# Patient Record
Sex: Female | Born: 1960 | Race: White | Hispanic: No | Marital: Married | Smoking: Never smoker
Health system: Southern US, Community
[De-identification: ages and names within clinical notes are randomized; demographics above are authoritative.]

## PROBLEM LIST (undated history)

## (undated) DIAGNOSIS — K219 Gastro-esophageal reflux disease without esophagitis: Secondary | ICD-10-CM

## (undated) DIAGNOSIS — I10 Essential (primary) hypertension: Secondary | ICD-10-CM

## (undated) DIAGNOSIS — E039 Hypothyroidism, unspecified: Secondary | ICD-10-CM

## (undated) DIAGNOSIS — J45909 Unspecified asthma, uncomplicated: Secondary | ICD-10-CM

## (undated) DIAGNOSIS — D649 Anemia, unspecified: Secondary | ICD-10-CM

## (undated) DIAGNOSIS — E7800 Pure hypercholesterolemia, unspecified: Secondary | ICD-10-CM

## (undated) HISTORY — PX: CHOLECYSTECTOMY: SHX55

---

## 2012-07-21 DIAGNOSIS — E668 Other obesity: Secondary | ICD-10-CM | POA: Insufficient documentation

## 2012-07-21 DIAGNOSIS — N762 Acute vulvitis: Secondary | ICD-10-CM | POA: Insufficient documentation

## 2012-07-21 DIAGNOSIS — Z8639 Personal history of other endocrine, nutritional and metabolic disease: Secondary | ICD-10-CM | POA: Insufficient documentation

## 2012-07-21 DIAGNOSIS — N904 Leukoplakia of vulva: Secondary | ICD-10-CM | POA: Insufficient documentation

## 2012-07-21 DIAGNOSIS — J4520 Mild intermittent asthma, uncomplicated: Secondary | ICD-10-CM | POA: Insufficient documentation

## 2012-07-25 DIAGNOSIS — E039 Hypothyroidism, unspecified: Secondary | ICD-10-CM | POA: Insufficient documentation

## 2012-07-25 DIAGNOSIS — D649 Anemia, unspecified: Secondary | ICD-10-CM | POA: Insufficient documentation

## 2012-07-27 DIAGNOSIS — I10 Essential (primary) hypertension: Secondary | ICD-10-CM | POA: Insufficient documentation

## 2013-07-24 ENCOUNTER — Other Ambulatory Visit: Payer: Self-pay | Admitting: *Deleted

## 2013-07-24 ENCOUNTER — Ambulatory Visit (INDEPENDENT_AMBULATORY_CARE_PROVIDER_SITE_OTHER): Payer: BC Managed Care – PPO | Admitting: Podiatry

## 2013-07-24 ENCOUNTER — Ambulatory Visit (INDEPENDENT_AMBULATORY_CARE_PROVIDER_SITE_OTHER): Payer: BC Managed Care – PPO

## 2013-07-24 ENCOUNTER — Encounter: Payer: Self-pay | Admitting: Podiatry

## 2013-07-24 VITALS — BP 113/66 | HR 65 | Resp 16 | Ht 63.0 in | Wt 205.0 lb

## 2013-07-24 DIAGNOSIS — M722 Plantar fascial fibromatosis: Secondary | ICD-10-CM

## 2013-07-24 DIAGNOSIS — M2010 Hallux valgus (acquired), unspecified foot: Secondary | ICD-10-CM

## 2013-07-24 DIAGNOSIS — M7750 Other enthesopathy of unspecified foot: Secondary | ICD-10-CM

## 2013-07-24 MED ORDER — TRIAMCINOLONE ACETONIDE 10 MG/ML IJ SUSP
10.0000 mg | Freq: Once | INTRAMUSCULAR | Status: AC
  Administered 2013-07-24: 10 mg

## 2013-07-24 NOTE — Progress Notes (Signed)
Subjective:     Patient ID: Beth Krause, female   DOB: 06/25/1960, 53 y.o.   MRN: [id number]  Foot Pain   patient presents stating I'm having a lot of pain in my left heel for the last month and also my feet in general get sore and I did calluses on both feet. Patient has tried increase activity and lose weight but the foot has been bothering her and preventing her from doing what she wants   Review of Systems  All other systems reviewed and are negative.      Objective:   Physical Exam  Nursing note and vitals reviewed. Constitutional: She is oriented to person, place, and time.  Cardiovascular: Intact distal pulses.   Musculoskeletal: Normal range of motion.  Neurological: She is oriented to person, place, and time.  Skin: Skin is warm.   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 Injected the left plantar fashion renograms Kenalog 5 of Xylocaine Marcaine mixture applied fascially brace and scanned for custom orthotic devices. Debris did plantar tissue to reduce pressure

## 2013-07-24 NOTE — Progress Notes (Signed)
   Subjective:    Patient ID: Beth Krause, female    DOB: 1960/09/11, 53 y.o.   MRN: [id number]  HPI Comments: i have a callus on both feet, a place on my left 5th toe, and heel pain in my left foot. i have had the foot problems for several weeks. They are getting worse. It hurts to walk and stand and at night im crying with pain. i take tylenol for the pain. i went to a podiatrist in [place] and he trimmed my rt foot and dug something out about 3 yrs ago.  Foot Pain      Review of Systems  All other systems reviewed and are negative.      Objective:   Physical Exam        Assessment & Plan:

## 2013-07-24 NOTE — Patient Instructions (Signed)

## 2013-08-13 ENCOUNTER — Encounter: Payer: Self-pay | Admitting: Podiatry

## 2013-08-13 NOTE — Progress Notes (Signed)
Patients orthotics are in .

## 2013-08-21 ENCOUNTER — Ambulatory Visit (INDEPENDENT_AMBULATORY_CARE_PROVIDER_SITE_OTHER): Payer: BC Managed Care – PPO | Admitting: *Deleted

## 2013-08-21 VITALS — BP 113/66 | HR 65 | Resp 16

## 2013-08-21 DIAGNOSIS — M722 Plantar fascial fibromatosis: Secondary | ICD-10-CM

## 2013-08-21 NOTE — Progress Notes (Signed)
Pt presents to pick up orthotics. Went over wearing instructions with pt. 

## 2013-08-21 NOTE — Patient Instructions (Signed)

## 2015-06-18 ENCOUNTER — Ambulatory Visit: Payer: Self-pay | Admitting: Podiatry

## 2015-07-01 ENCOUNTER — Telehealth: Payer: Self-pay | Admitting: Podiatry

## 2015-07-01 NOTE — Telephone Encounter (Signed)
Pt lvm to reschedule her appt. I called back and left message for pt to call back to reschedule

## 2015-07-16 ENCOUNTER — Ambulatory Visit (INDEPENDENT_AMBULATORY_CARE_PROVIDER_SITE_OTHER): Payer: BLUE CROSS/BLUE SHIELD | Admitting: Podiatry

## 2015-07-16 DIAGNOSIS — M722 Plantar fascial fibromatosis: Secondary | ICD-10-CM | POA: Diagnosis not present

## 2015-07-16 MED ORDER — METHYLPREDNISOLONE 4 MG PO TBPK: ORAL_TABLET | ORAL | Status: DC

## 2015-07-17 NOTE — Progress Notes (Signed)
She presents today with continued heel pain left foot. She states that she did great for many months past. She had a recurrence saw Dr. Charlsie Merlesregal last.  Objective: Vital signs are stable she is alert and oriented 3. She has pain on palpation medial calcaneal tubercle of the left heel. Pulses are strongly palpable. Neurologic sensorium is intact. Degenerative flexor intact. Cutaneous evaluation shows a well hydrated cutis.  Assessment: Chronic intractable plantar fasciitis left.  Plan: I reinjected the left heel today with Kenalog and local anesthetic. Prescribed methylprednisolone. Patient stated that she had a plantar fascial brace and a night splint and I recommended that she use that. We did discuss appropriate shoe gear stretching exercises ice therapy and shoe gear modifications. Follow up with her as needed.

## 2015-09-10 ENCOUNTER — Telehealth: Payer: Self-pay | Admitting: *Deleted

## 2015-09-10 NOTE — Telephone Encounter (Signed)
ok 

## 2015-09-10 NOTE — Telephone Encounter (Addendum)
Pt states she is going on vacation and her foot is starting to hurt, she had a steroid dose pack about 2 months ago and would like a refill.  Pt states she doesn't have time to come in for an appt, but will make an appt if the problem continues after vacation. 09/11/2015-Dr. Hyatt okayed refill of the Medrol dose pack.  Left message informing pt the Medrol dose pack would be at the [facility] in [place].

## 2015-09-11 MED ORDER — METHYLPREDNISOLONE 4 MG PO TBPK: ORAL_TABLET | ORAL | 0 refills | Status: DC

## 2015-10-01 ENCOUNTER — Encounter: Payer: Self-pay | Admitting: Podiatry

## 2015-10-01 ENCOUNTER — Ambulatory Visit (INDEPENDENT_AMBULATORY_CARE_PROVIDER_SITE_OTHER): Payer: BLUE CROSS/BLUE SHIELD | Admitting: Podiatry

## 2015-10-01 DIAGNOSIS — Q828 Other specified congenital malformations of skin: Secondary | ICD-10-CM

## 2015-10-01 DIAGNOSIS — M722 Plantar fascial fibromatosis: Secondary | ICD-10-CM | POA: Diagnosis not present

## 2015-10-01 NOTE — Progress Notes (Signed)
She presents today for follow-up of her plantar fasciitis of her left foot. She states that the medial aspect of the heel is doing much better however the lateral aspect is just killing me. She is also concerned about an area of reactive hyperkeratosis of fifth metatarsal head of the right foot. She relates that she had an MRI performed in March of last year. She will bring that with her on her next visit.  Objective: Vital signs are stable she is alert and oriented 3 pain on palpation to the medial aspect of the left heel has resolved however she has significant pain to the lateral aspect of the left heel plantar tubercle and lateral fifth metatarsal area. Right foot does demonstrates reactive hyperkeratosis of fifth metatarsal head of the right foot with no open lesions.  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  Plan: I injected the lateral aspect of the foot today reaching the central plantar calcaneal tubercle as well. This was injected with Kenalog and local anesthetic also debrided all reactive hyperkeratosis. Encouraged her to bring the MRI with her that her next visit. I also prescribed Voltaren gel and have requested a compounded agent for anti-inflammatory and pain.

## 2015-10-03 ENCOUNTER — Telehealth: Payer: Self-pay | Admitting: *Deleted

## 2015-10-03 MED ORDER — NONFORMULARY OR COMPOUNDED ITEM: 6 refills | Status: DC

## 2015-10-03 NOTE — Telephone Encounter (Addendum)
-----   Message from Kristian CoveyAshley E Prevette, [facility] sent at 10/01/2015  4:17 PM EDT ----- Regarding: Compound med Dr. Al CorpusHyatt asked if you could send her in a compounding cream that's for pain and anti-inflammatory. Thanks! [phone]/31/2017-Faxed to [facility].

## 2015-10-09 DIAGNOSIS — M722 Plantar fascial fibromatosis: Secondary | ICD-10-CM | POA: Insufficient documentation

## 2015-11-12 ENCOUNTER — Ambulatory Visit: Payer: BLUE CROSS/BLUE SHIELD | Admitting: Podiatry

## 2017-10-20 DIAGNOSIS — M898X1 Other specified disorders of bone, shoulder: Secondary | ICD-10-CM | POA: Insufficient documentation

## 2019-01-03 ENCOUNTER — Other Ambulatory Visit: Payer: Self-pay | Admitting: Obstetrics and Gynecology

## 2019-01-03 DIAGNOSIS — Z1231 Encounter for screening mammogram for malignant neoplasm of breast: Secondary | ICD-10-CM

## 2019-01-16 ENCOUNTER — Encounter (INDEPENDENT_AMBULATORY_CARE_PROVIDER_SITE_OTHER): Payer: Self-pay

## 2019-01-16 ENCOUNTER — Other Ambulatory Visit: Payer: Self-pay

## 2019-01-16 ENCOUNTER — Ambulatory Visit
Admission: RE | Admit: 2019-01-16 | Discharge: 2019-01-16 | Disposition: A | Discharge status: Home / Self Care | Payer: BC Managed Care – PPO | Source: Ambulatory Visit | Attending: Obstetrics and Gynecology | Admitting: Obstetrics and Gynecology

## 2019-01-16 DIAGNOSIS — Z1231 Encounter for screening mammogram for malignant neoplasm of breast: Secondary | ICD-10-CM | POA: Diagnosis present

## 2019-01-17 ENCOUNTER — Inpatient Hospital Stay
Admission: RE | Admit: 2019-01-17 | Discharge: 2019-01-17 | Disposition: A | Discharge status: Home / Self Care | Payer: Self-pay | Source: Ambulatory Visit | Attending: *Deleted | Admitting: *Deleted

## 2019-01-17 ENCOUNTER — Other Ambulatory Visit: Payer: Self-pay | Admitting: *Deleted

## 2019-01-17 DIAGNOSIS — Z1231 Encounter for screening mammogram for malignant neoplasm of breast: Secondary | ICD-10-CM

## 2019-06-06 ENCOUNTER — Ambulatory Visit: Payer: 59 | Admitting: Podiatry

## 2019-06-06 ENCOUNTER — Other Ambulatory Visit: Payer: Self-pay | Admitting: Podiatry

## 2019-06-06 ENCOUNTER — Ambulatory Visit (INDEPENDENT_AMBULATORY_CARE_PROVIDER_SITE_OTHER): Payer: 59

## 2019-06-06 ENCOUNTER — Other Ambulatory Visit: Payer: Self-pay

## 2019-06-06 VITALS — Temp 98.1°F

## 2019-06-06 DIAGNOSIS — M2042 Other hammer toe(s) (acquired), left foot: Secondary | ICD-10-CM | POA: Diagnosis not present

## 2019-06-06 DIAGNOSIS — M2041 Other hammer toe(s) (acquired), right foot: Secondary | ICD-10-CM

## 2019-06-06 DIAGNOSIS — Q828 Other specified congenital malformations of skin: Secondary | ICD-10-CM

## 2019-06-06 DIAGNOSIS — N950 Postmenopausal bleeding: Secondary | ICD-10-CM | POA: Insufficient documentation

## 2019-06-06 DIAGNOSIS — M7752 Other enthesopathy of left foot: Secondary | ICD-10-CM | POA: Diagnosis not present

## 2019-06-06 DIAGNOSIS — M7751 Other enthesopathy of right foot: Secondary | ICD-10-CM | POA: Diagnosis not present

## 2019-06-06 DIAGNOSIS — M722 Plantar fascial fibromatosis: Secondary | ICD-10-CM

## 2019-06-06 NOTE — Progress Notes (Signed)
  Subjective:  Patient ID: Beth Krause, female    DOB: 29-Mar-1960,  MRN: [id number] HPI Chief Complaint  Patient presents with  . Foot Pain    Patient presents today for bilat painful callous lesions bottom of 5th met x weeks.  She also c/o pain in her feet all the time, says her toes ache and left is worse than right.  She denies any pain with bunions  . Callouses    59 y.o. female presents with the above complaint.   ROS: Denies fever chills nausea vomiting muscle aches pains calf pain back pain chest pain shortness of breath.  No past medical history on file. No past surgical history on file.  Current Outpatient Medications:  .  Cholecalciferol (VITAMIN D3) 1.25 MG (50000 UT) TABS, Take by mouth., Disp: , Rfl:  .  BREO ELLIPTA 200-25 MCG/INH AEPB, Inhale 1 puff into the lungs daily., Disp: , Rfl:  .  cetirizine (ZYRTEC) 10 MG tablet, Take by mouth., Disp: , Rfl:  .  COMBIVENT RESPIMAT 20-100 MCG/ACT AERS respimat, , Disp: , Rfl:  .  hyoscyamine (LEVSIN) 0.125 MG tablet, Take by mouth every 4 (four) hours as needed., Disp: , Rfl:  .  levothyroxine (SYNTHROID, LEVOTHROID) 50 MCG tablet, , Disp: , Rfl:  .  lisinopril (PRINIVIL,ZESTRIL) 10 MG tablet, , Disp: , Rfl:  .  omeprazole (PRILOSEC) 40 MG capsule, Take by mouth., Disp: , Rfl:  .  PROAIR HFA 108 (90 BASE) MCG/ACT inhaler, , Disp: , Rfl:   No Known Allergies Review of Systems Objective:   Vitals:   06/06/19 1551  Temp: 98.1 F (36.7 C)    General: Well developed, nourished, in no acute distress, alert and oriented x3   Dermatological: Skin is warm, dry and supple bilateral. Nails x 10 are well maintained; remaining integument appears unremarkable at this time. There are no open sores, no preulcerative lesions, no rash or signs of infection present.  Porokeratotic lesion plantar aspect subfifth metatarsals bilateral.  Underlying bursitis.  Vascular: Dorsalis Pedis artery and Posterior Tibial artery pedal pulses are 2/4  bilateral with immedate capillary fill time. Pedal hair growth present. No varicosities and no lower extremity edema present bilateral.   Neruologic: Grossly intact via light touch bilateral. Vibratory intact via tuning fork bilateral. Protective threshold with Semmes Wienstein monofilament intact to all pedal sites bilateral. Patellar and Achilles deep tendon reflexes 2+ bilateral. No Babinski or clonus noted bilateral.   Musculoskeletal: No gross boney pedal deformities bilateral. No pain, crepitus, or limitation noted with foot and ankle range of motion bilateral. Muscular strength 5/5 in all groups tested bilateral.  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  Her foot was placed in a curve shoe causing lateral pain and swelling.  Gait: Unassisted, Nonantalgic.    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  Mild hammertoe deformity.  Assessment & Plan:   Assessment: Bursitis subfifth met bilateral with porokeratosis.  Plan: Injected dexamethasone 2 mg sublesional he today into the bursa and then debrided the reactive hyperkeratotic lesions.  She was also scanned for a new set of orthotics with some 5 cut outs.     Hakim Minniefield T. [place], [place]

## 2019-06-11 ENCOUNTER — Encounter: Payer: Self-pay | Admitting: Podiatry

## 2019-06-12 ENCOUNTER — Encounter: Payer: Self-pay | Admitting: Podiatry

## 2019-06-14 MED ORDER — NONFORMULARY OR COMPOUNDED ITEM: 2 refills | Status: DC

## 2019-06-14 NOTE — Telephone Encounter (Signed)
Patient has been notified of medication renewal.  I informed her that I will call it into [facility] in [place].  She stated "that's perfect, its on my way home"   Antiinflammatory cream has been called into [facility].

## 2019-07-04 ENCOUNTER — Ambulatory Visit: Payer: No Typology Code available for payment source | Admitting: Orthotics

## 2019-07-04 ENCOUNTER — Other Ambulatory Visit: Payer: Self-pay

## 2019-07-04 DIAGNOSIS — M722 Plantar fascial fibromatosis: Secondary | ICD-10-CM

## 2019-07-04 DIAGNOSIS — M7752 Other enthesopathy of left foot: Secondary | ICD-10-CM

## 2019-07-04 NOTE — Progress Notes (Signed)
Patient came in today to pick up custom made foot orthotics.  The goals were accomplished and the patient reported no dissatisfaction with said orthotics.  Patient was advised of breakin period and how to report any issues. 

## 2019-07-18 ENCOUNTER — Ambulatory Visit (INDEPENDENT_AMBULATORY_CARE_PROVIDER_SITE_OTHER): Payer: No Typology Code available for payment source | Admitting: Orthotics

## 2019-07-18 ENCOUNTER — Other Ambulatory Visit: Payer: Self-pay

## 2019-07-18 DIAGNOSIS — M7752 Other enthesopathy of left foot: Secondary | ICD-10-CM

## 2019-07-18 DIAGNOSIS — M722 Plantar fascial fibromatosis: Secondary | ICD-10-CM

## 2019-07-18 NOTE — Progress Notes (Signed)
Sending f/o back to get remade:  Wider, hug arch more, deep heel cup

## 2020-01-09 ENCOUNTER — Encounter: Payer: Self-pay | Admitting: Podiatry

## 2020-01-09 ENCOUNTER — Other Ambulatory Visit: Payer: Self-pay

## 2020-01-09 ENCOUNTER — Ambulatory Visit: Payer: No Typology Code available for payment source | Admitting: Podiatry

## 2020-01-09 DIAGNOSIS — L989 Disorder of the skin and subcutaneous tissue, unspecified: Secondary | ICD-10-CM

## 2020-01-09 DIAGNOSIS — M722 Plantar fascial fibromatosis: Secondary | ICD-10-CM

## 2020-01-09 MED ORDER — TRIAMCINOLONE ACETONIDE 40 MG/ML IJ SUSP
20.0000 mg | Freq: Once | INTRAMUSCULAR | Status: AC
  Administered 2020-01-09: 20 mg

## 2020-01-09 NOTE — Progress Notes (Signed)
She presents today for follow-up of her bilateral heel states that she Figgs that the fasciitis is back in both heels but the left one is the worst.  She is also getting some small callused areas on the lateral aspect of the fifth metatarsal base area.  Objective: Vital signs are stable alert and oriented x3.  Pulses are palpable.  She has a small linear area of porokeratotic lesions most likely secondary to the orthotic riding on that ridge.  She also has some mild tenderness on palpation medial calcaneal tubercle of the left heel.  Assessment: Residual plantar fasciitis left.  Porokeratosis left foot.  Plan: Debridement of the porokeratosis today reinjected left heel today 20 mg Kenalog 5 mg Marcaine she will see Raiford Noble for follow-up to widen the orthotic so that does not rub her on that range.

## 2020-01-16 ENCOUNTER — Encounter: Payer: Self-pay | Admitting: Podiatry

## 2020-01-16 ENCOUNTER — Other Ambulatory Visit: Payer: Self-pay

## 2020-01-16 ENCOUNTER — Ambulatory Visit: Payer: No Typology Code available for payment source | Admitting: Orthotics

## 2020-01-16 DIAGNOSIS — Q828 Other specified congenital malformations of skin: Secondary | ICD-10-CM

## 2020-01-16 DIAGNOSIS — M722 Plantar fascial fibromatosis: Secondary | ICD-10-CM

## 2020-01-16 NOTE — Progress Notes (Signed)
Modify f/o and make wider w/ lateral wedge.

## 2020-01-18 NOTE — Telephone Encounter (Signed)
Aware. 

## 2020-02-20 ENCOUNTER — Encounter: Payer: No Typology Code available for payment source | Admitting: Orthotics

## 2020-03-05 ENCOUNTER — Other Ambulatory Visit: Payer: Self-pay

## 2020-03-05 ENCOUNTER — Other Ambulatory Visit: Payer: Self-pay | Admitting: Obstetrics and Gynecology

## 2020-03-05 ENCOUNTER — Ambulatory Visit: Payer: No Typology Code available for payment source | Admitting: Orthotics

## 2020-03-05 DIAGNOSIS — Z1231 Encounter for screening mammogram for malignant neoplasm of breast: Secondary | ICD-10-CM

## 2020-03-05 DIAGNOSIS — M722 Plantar fascial fibromatosis: Secondary | ICD-10-CM

## 2020-03-05 DIAGNOSIS — Q828 Other specified congenital malformations of skin: Secondary | ICD-10-CM

## 2020-03-05 NOTE — Progress Notes (Signed)
Patient came in today to pick up custom made foot orthotics.  The goals were accomplished and the patient reported no dissatisfaction with said orthotics.  Patient was advised of breakin period and how to report any issues. 

## 2020-03-25 ENCOUNTER — Ambulatory Visit
Admission: RE | Admit: 2020-03-25 | Discharge: 2020-03-25 | Disposition: A | Discharge status: Home / Self Care | Payer: No Typology Code available for payment source | Source: Ambulatory Visit

## 2020-03-25 ENCOUNTER — Other Ambulatory Visit: Payer: Self-pay

## 2020-03-25 DIAGNOSIS — Z1231 Encounter for screening mammogram for malignant neoplasm of breast: Secondary | ICD-10-CM | POA: Insufficient documentation

## 2020-06-24 ENCOUNTER — Encounter: Payer: Self-pay | Admitting: Podiatry

## 2020-06-24 ENCOUNTER — Other Ambulatory Visit: Payer: Self-pay

## 2020-06-24 ENCOUNTER — Ambulatory Visit: Payer: No Typology Code available for payment source | Admitting: Podiatry

## 2020-06-24 DIAGNOSIS — M722 Plantar fascial fibromatosis: Secondary | ICD-10-CM | POA: Diagnosis not present

## 2020-06-24 DIAGNOSIS — B070 Plantar wart: Secondary | ICD-10-CM

## 2020-06-24 MED ORDER — BETAMETHASONE SOD PHOS & ACET 6 (3-3) MG/ML IJ SUSP: 3.0000 mg | Freq: Once | INTRAMUSCULAR | Status: DC

## 2020-06-24 NOTE — Progress Notes (Signed)
   Subjective: 60 y.o. female presenting today for multiple complaints today regarding left heel pain that is been going on for about 2 weeks now.  She states that it is very painful all the time.  She denies a history of injury.  She does have a history of plantar fasciitis to the left foot.  Patient also states that she has developed symptomatic skin lesions to the plantar aspect of the left foot subfifth metatarsal tubercle area.  She has had them debrided in the past however they continue to recur.  They are very symptomatic with weightbearing.  She presents for further treatment evaluation   No past medical history on file.  Objective: Physical Exam General: The patient is alert and oriented x3 in no acute distress.   Dermatology: Hyperkeratotic skin lesion(s) noted to the plantar aspect of the left foot just plantar to the fifth metatarsal tubercle.  There does appear to be a small cluster of lesions. Pinpoint bleeding noted upon debridement. Skin is warm, dry and supple bilateral lower extremities. Negative for open lesions or macerations.   Vascular: Palpable pedal pulses bilaterally. No edema or erythema noted. Capillary refill within normal limits.   Neurological: Epicritic and protective threshold grossly intact bilaterally.    Musculoskeletal Exam: Pain on palpation to the noted skin lesion(s).  Range of motion within normal limits to all pedal and ankle joints bilateral. Muscle strength 5/5 in all groups bilateral.  There is also pain on palpation to the medial continue tubercle plantar fascia left.   Assessment: #1 plantar warts left foot #2 plantar fasciitis left   Plan of Care:  #1 Patient was evaluated. #2 Excisional debridement of the plantar wart lesion(s) was performed using a chisel blade.  Salicylic acid was applied and the lesion(s) was dressed with a dry sterile dressing. #3  Recommend OTC salicylic acid daily x1-2 weeks  4.  Injection of 0.5 cc Celestone Soluspan  injected into the plantar fascia left  5.  Continue wearing custom molded orthotics and good supportive stability shoes  6.  Patient is to return to clinic as needed  *Physical therapist  Felecia Shelling, DPM [facility]  Dr. Felecia Shelling, DPM    [address] [place], [place] [address]                Office [phone]  Fax [phone]

## 2020-12-25 LAB — COLOGUARD: COLOGUARD: NEGATIVE

## 2021-02-25 ENCOUNTER — Other Ambulatory Visit: Payer: Self-pay | Admitting: Student

## 2021-02-25 DIAGNOSIS — Z1231 Encounter for screening mammogram for malignant neoplasm of breast: Secondary | ICD-10-CM

## 2021-03-24 ENCOUNTER — Encounter: Payer: Self-pay | Admitting: Podiatry

## 2021-03-24 ENCOUNTER — Ambulatory Visit: Payer: No Typology Code available for payment source | Admitting: Podiatry

## 2021-03-24 ENCOUNTER — Other Ambulatory Visit: Payer: Self-pay

## 2021-03-24 DIAGNOSIS — L989 Disorder of the skin and subcutaneous tissue, unspecified: Secondary | ICD-10-CM

## 2021-03-24 NOTE — Progress Notes (Signed)
° °  Subjective: 61 y.o. female presenting to the office today presenting for evaluation of a symptomatic callus to the plantar aspect of the fifth met tubercle left.  She states it is slowly come back over the last few months.  She presents for further treatment and evaluation  Patient also has a history of plantar fasciitis left foot that is currently asymptomatic and she is doing well   No past medical history on file.   Objective:  Physical Exam General: Alert and oriented x3 in no acute distress  Dermatology: Hyperkeratotic lesion(s) present on the plantar aspect of the fifth metatarsal tubercle left. Pain on palpation with a central nucleated core noted. Skin is warm, dry and supple bilateral lower extremities. Negative for open lesions or macerations.  Vascular: Palpable pedal pulses bilaterally. No edema or erythema noted. Capillary refill within normal limits.  Neurological: Epicritic and protective threshold grossly intact bilaterally.   Musculoskeletal Exam: No pedal deformity noted  Assessment: 1.  Porokeratosis plantar aspect of the fifth met tubercle left 2. H/o plantar fasciitis left   Plan of Care:  1. Patient evaluated 2. Excisional debridement of keratoic lesion(s) using a chisel blade was performed without incident.  3.  Salicylic acid applied.  Dressed area with light dressing. 4.  Recommend salicylic acid daily.  Provided.   5.  Continue custom molded orthotics  6.  Patient is to return to the clinic 4 weeks for follow-up  *Physical therapist at [facility]  Edrick Kins, DPM [facility]  Dr. Edrick Kins, [address]                                        [place], [place] [address]                Office [phone]  Fax [phone]

## 2021-04-08 ENCOUNTER — Ambulatory Visit
Admission: RE | Admit: 2021-04-08 | Discharge: 2021-04-08 | Disposition: A | Discharge status: Home / Self Care | Payer: No Typology Code available for payment source | Source: Ambulatory Visit | Attending: Student | Admitting: Student

## 2021-04-08 ENCOUNTER — Other Ambulatory Visit: Payer: Self-pay

## 2021-04-08 DIAGNOSIS — Z1231 Encounter for screening mammogram for malignant neoplasm of breast: Secondary | ICD-10-CM | POA: Diagnosis not present

## 2021-04-28 ENCOUNTER — Other Ambulatory Visit: Payer: Self-pay

## 2021-04-28 ENCOUNTER — Ambulatory Visit: Payer: No Typology Code available for payment source | Admitting: Podiatry

## 2021-04-28 DIAGNOSIS — L989 Disorder of the skin and subcutaneous tissue, unspecified: Secondary | ICD-10-CM | POA: Diagnosis not present

## 2021-04-28 NOTE — Progress Notes (Signed)
? ?  HPI: 61 y.o. female presenting today for follow-up evaluation of symptomatic porokeratosis to the plantar aspect of the fifth metatarsal tubercle left foot.  Patient states that she is doing much better.  She has been applying salicylic acid as instructed.  She says that she had a significant amount of skin slough from the area.  She does not feel the callus lesion anymore. ? ?Patient also has a new complaint today regarding pain and tenderness to the right midfoot.  She says it has been present for about 2 weeks now.  She denies a history of injury.  She says it was a gradual onset.  She presents for further treatment and evaluation ? ?No past medical history on file. ? ?No past surgical history on file. ? ?No Known Allergies ?  ?Physical Exam: ?General: The patient is alert and oriented x3 in no acute distress. ? ?Dermatology: Skin is warm, dry and supple bilateral lower extremities. Negative for open lesions or macerations.  The porokeratosis to the plantar aspect of the left foot appears to be resolved.  There is some slight maceration of skin but there is no central nucleated core that was present last visit ? ?Vascular: Palpable pedal pulses bilaterally. Capillary refill within normal limits.  Negative for any significant edema or erythema ? ?Neurological: Light touch and protective threshold grossly intact ? ?Musculoskeletal Exam: No pedal deformities noted ? ? ?Assessment: ?1.  Porokeratosis plantar aspect of the left fifth metatarsal tubercle left; resolved ?2.  History of plantar fasciitis left; currently asymptomatic ?3.  Minimally symptomatic capsulitis right midfoot ? ? ?Plan of Care:  ?1. Patient evaluated.  ?2.  Light debridement of the macerated thick skin was performed using a 312 scalpel without incident or bleeding.   ?3.  Patient may resume full activity no restrictions ?4.  In regards to the right foot pain across the top of the midfoot, we will simply observe for now to see if this resolves  over time  ?5.  Return to clinic as needed ?  ?*Physical therapist at [facility] ?  ?Felecia Shelling, DPM ?[facility] ? ?Dr. Felecia Shelling, DPM  ?  ?[address]                                        ?[place], [place] [address]                ?Office [phone]  ?Fax [phone] ? ? ? ? ?

## 2021-07-23 ENCOUNTER — Ambulatory Visit
Admission: EM | Admit: 2021-07-23 | Discharge: 2021-07-23 | Disposition: A | Discharge status: Home / Self Care | Payer: BC Managed Care – PPO | Attending: Student | Admitting: Student

## 2021-07-23 ENCOUNTER — Ambulatory Visit (INDEPENDENT_AMBULATORY_CARE_PROVIDER_SITE_OTHER): Payer: BC Managed Care – PPO

## 2021-07-23 DIAGNOSIS — R059 Cough, unspecified: Secondary | ICD-10-CM | POA: Diagnosis not present

## 2021-07-23 NOTE — ED Provider Notes (Addendum)
Erroneous encounter -  PCP ordered CXR outpatient, the patient presented to the urgent care and was registered here, we proceeded with the xray under my order before realizing she had an outpatient order for this already. She was not seen by provider today.   Rhys Martini, PA-C 07/23/21 1603    Rhys Martini, PA-C 07/23/21 [phone]

## 2021-07-23 NOTE — ED Triage Notes (Addendum)
Patient seen PCP yesterday -- wanted her to get a chest x ray. Has been on prednisone since last Monday. Not feeling any better.

## 2022-03-24 ENCOUNTER — Other Ambulatory Visit: Payer: Self-pay | Admitting: Obstetrics and Gynecology

## 2022-03-24 DIAGNOSIS — Z1231 Encounter for screening mammogram for malignant neoplasm of breast: Secondary | ICD-10-CM

## 2022-04-14 ENCOUNTER — Ambulatory Visit: Payer: BC Managed Care – PPO

## 2022-04-19 ENCOUNTER — Ambulatory Visit
Admission: RE | Admit: 2022-04-19 | Discharge: 2022-04-19 | Disposition: A | Discharge status: Home / Self Care | Payer: BC Managed Care – PPO | Source: Ambulatory Visit | Attending: Obstetrics and Gynecology | Admitting: Obstetrics and Gynecology

## 2022-04-19 DIAGNOSIS — Z1231 Encounter for screening mammogram for malignant neoplasm of breast: Secondary | ICD-10-CM | POA: Diagnosis present

## 2022-05-22 IMAGING — MG MM DIGITAL SCREENING BILAT W/ TOMO AND CAD
8 series · 8 of 24 positions shown · non-contrast
Comparison: Previous exam(s).

CLINICAL DATA: Screening.

EXAM:
DIGITAL SCREENING BILATERAL MAMMOGRAM WITH TOMOSYNTHESIS AND CAD
TECHNIQUE: Bilateral screening digital craniocaudal and mediolateral oblique
mammograms were obtained. Bilateral screening digital breast
tomosynthesis was performed. The images were evaluated with
computer-aided detection.

[L CC synth-2D]
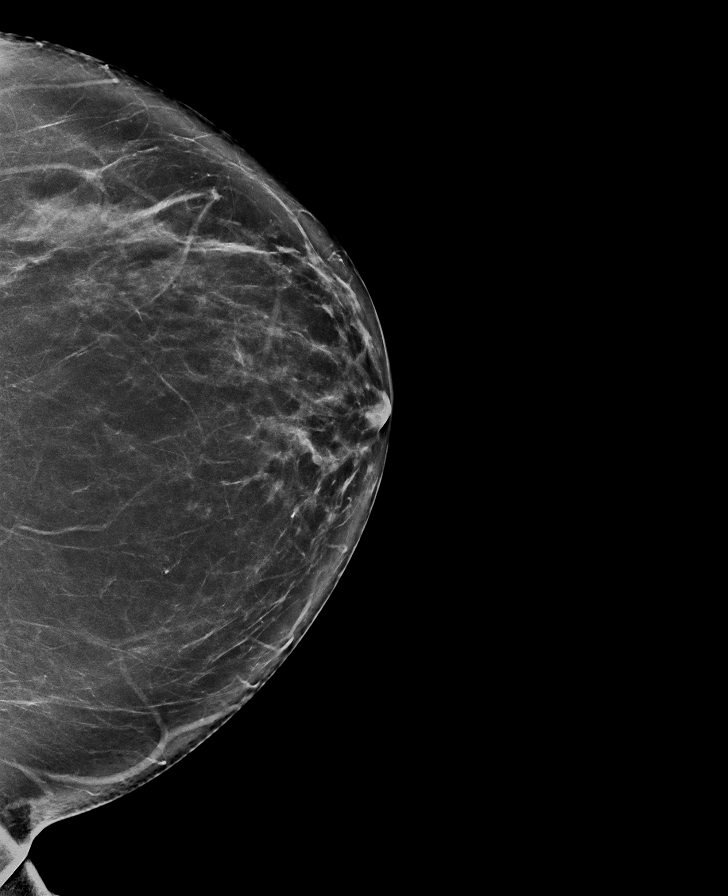

[R CC synth-2D]
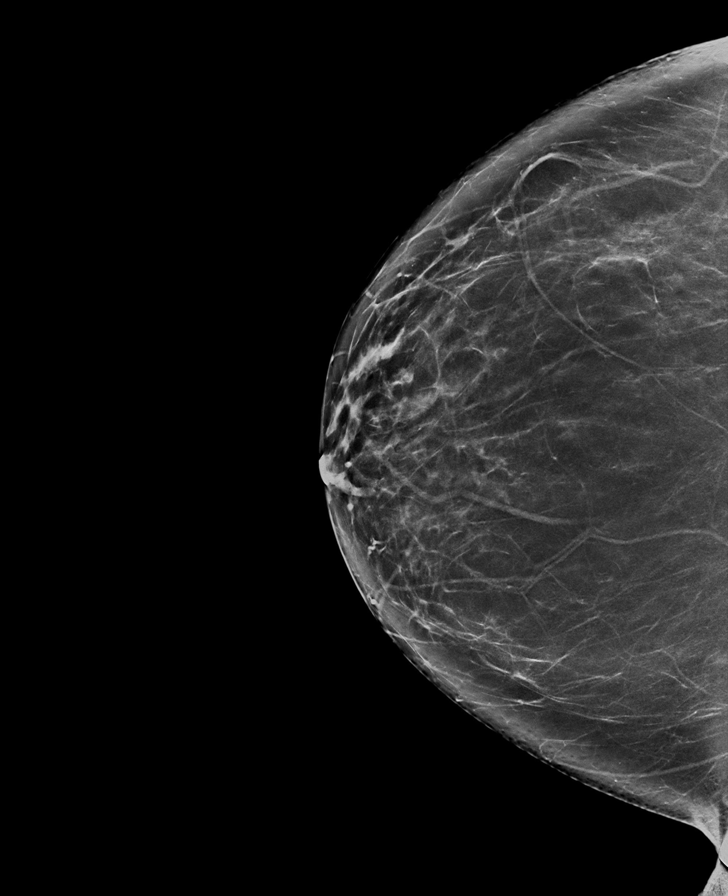

[L MLO synth-2D]
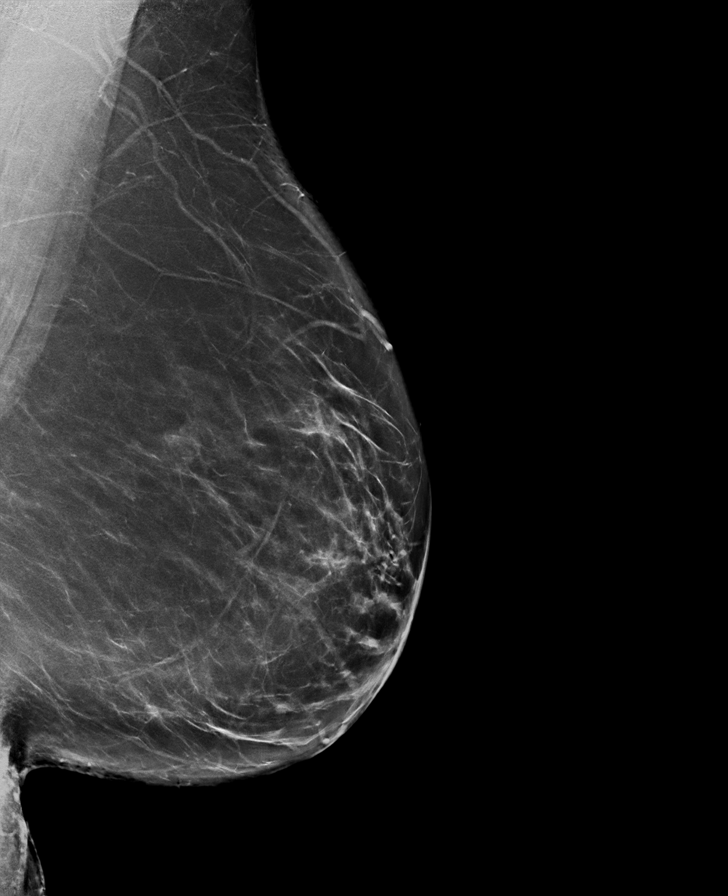

[R MLO synth-2D]
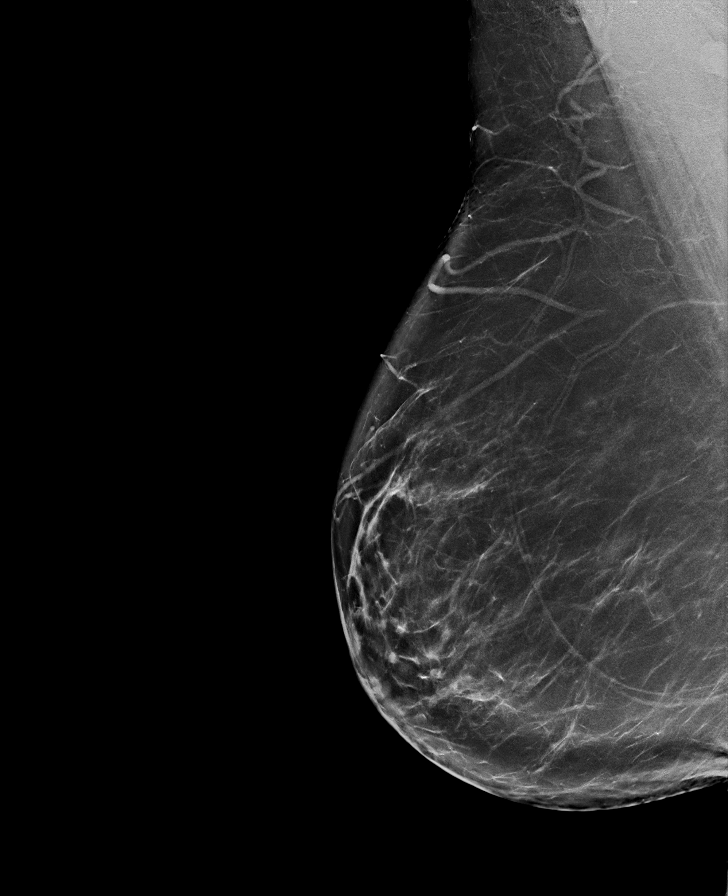

[L MLO tomo · tomo slice 49/96.0]
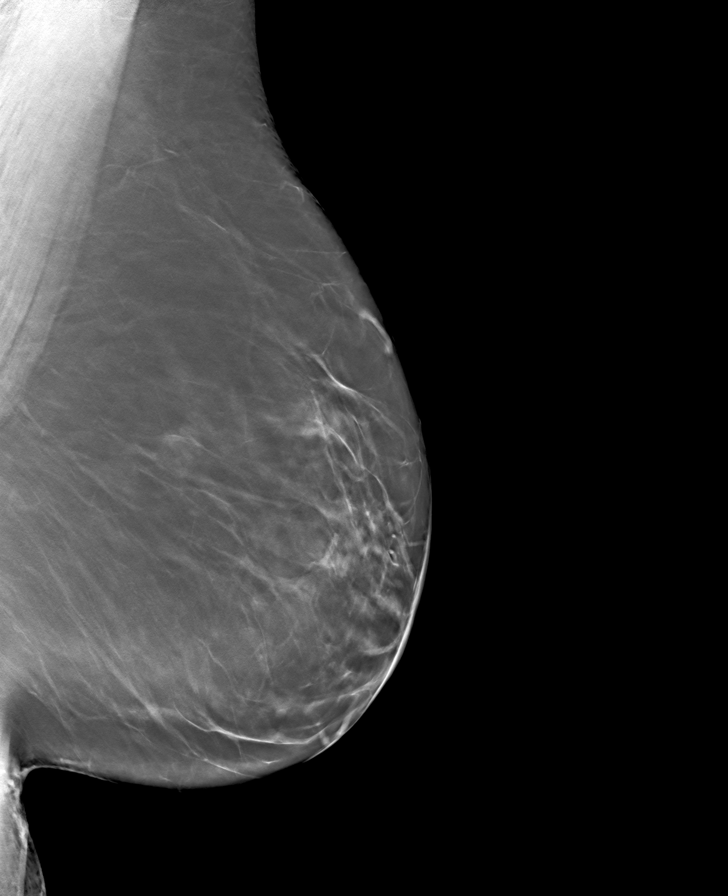

[R CC tomo · tomo slice 41/82.0]
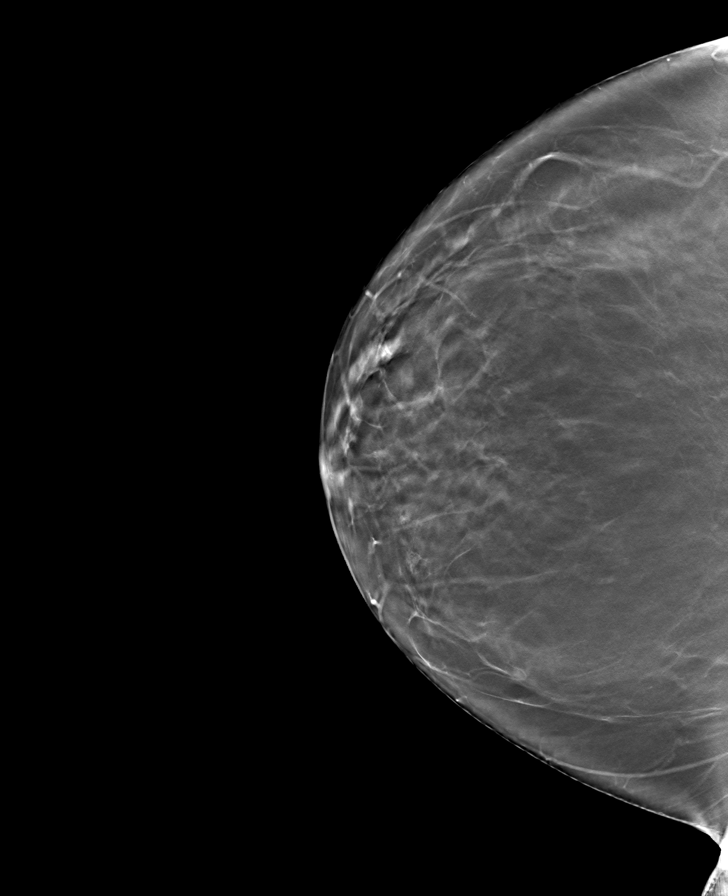

[L CC tomo · tomo slice 43/84.0]
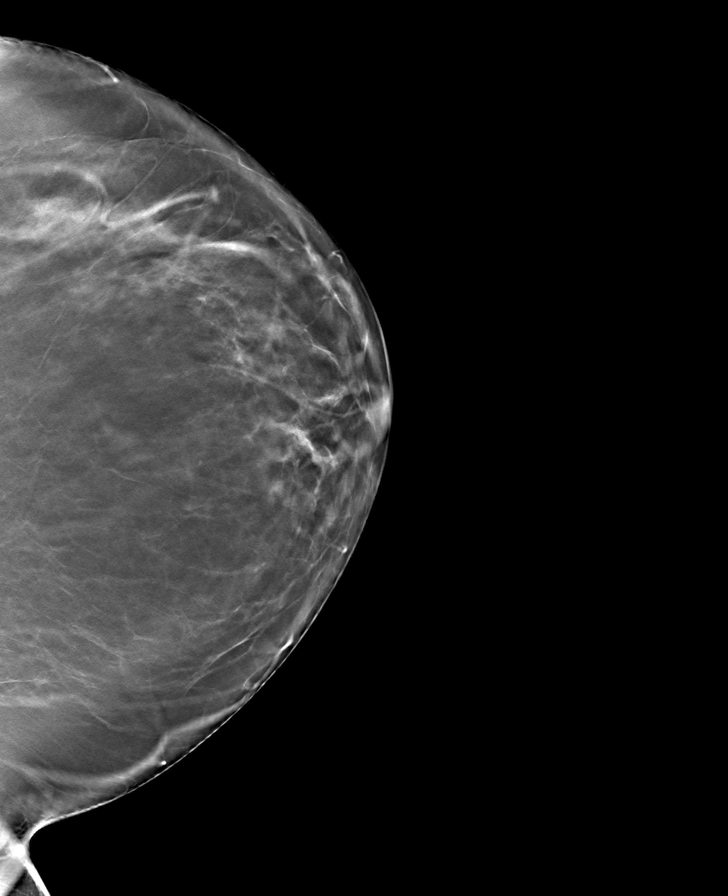

[R MLO tomo · tomo slice 47/92.0]
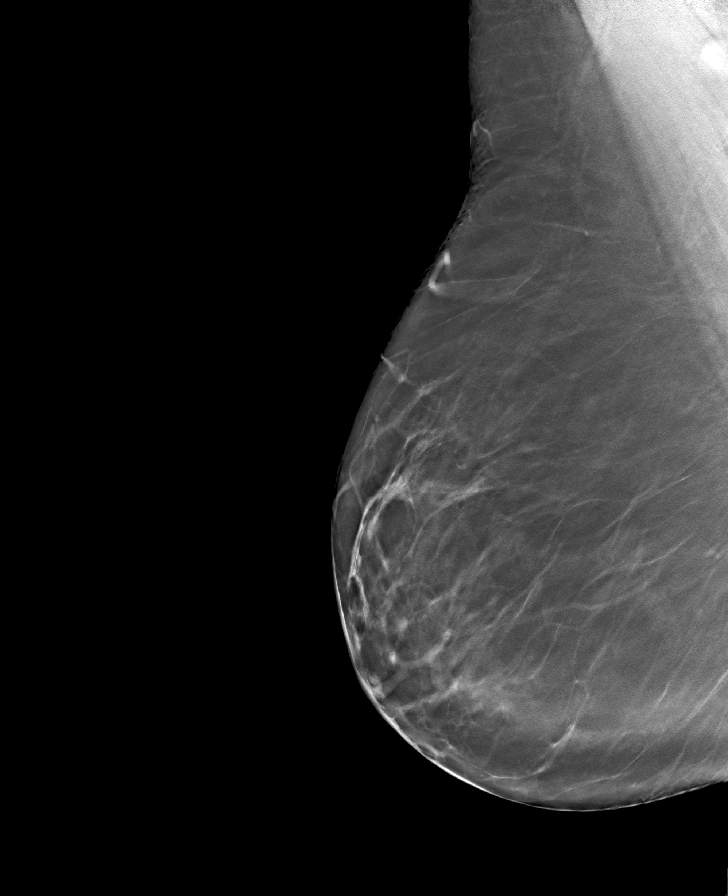

[8 of 24 positions shown; findings below may reference images not displayed]

ACR Breast Density Category b: There are scattered areas of
fibroglandular density.
FINDINGS: There are no findings suspicious for malignancy.
IMPRESSION: No mammographic evidence of malignancy. A result letter of this
screening mammogram will be mailed directly to the patient.

RECOMMENDATION:
Screening mammogram in one year. (Code:51-O-LD2)

BI-RADS CATEGORY  1: Negative.

## 2022-06-03 ENCOUNTER — Ambulatory Visit: Payer: BC Managed Care – PPO | Admitting: Physical Therapy

## 2022-06-10 ENCOUNTER — Ambulatory Visit: Payer: BC Managed Care – PPO | Admitting: Physical Therapy

## 2022-06-15 ENCOUNTER — Encounter: Payer: BC Managed Care – PPO | Admitting: Physical Therapy

## 2022-06-22 ENCOUNTER — Encounter: Payer: BC Managed Care – PPO | Admitting: Physical Therapy

## 2022-06-24 ENCOUNTER — Encounter: Payer: BC Managed Care – PPO | Admitting: Physical Therapy

## 2022-06-29 ENCOUNTER — Encounter: Payer: BC Managed Care – PPO | Admitting: Physical Therapy

## 2022-07-23 ENCOUNTER — Ambulatory Visit: Payer: BC Managed Care – PPO | Admitting: Podiatry

## 2022-07-23 ENCOUNTER — Encounter: Payer: Self-pay | Admitting: Podiatry

## 2022-07-23 ENCOUNTER — Ambulatory Visit (INDEPENDENT_AMBULATORY_CARE_PROVIDER_SITE_OTHER): Payer: BC Managed Care – PPO | Admitting: Podiatry

## 2022-07-23 VITALS — BP 116/70 | HR 70

## 2022-07-23 DIAGNOSIS — M722 Plantar fascial fibromatosis: Secondary | ICD-10-CM

## 2022-07-23 NOTE — Progress Notes (Unsigned)
Patient was scanned today with the [facility] scanner for new orthotics for PF.  Patient notified of charges and financial paper signed.   Will call patient when orthotics arrive at office to schedule for followup apt , pt wasnts a [id number] appt possibly on 7/19

## 2022-07-23 NOTE — Progress Notes (Signed)
   Chief Complaint  Patient presents with   Foot Orthotics    "I was told I needed to see a doctor before I get measured for new orthotics."    HPI: 62 y.o. female presenting today to have new orthotics made.  Patient states the orthotics helped tremendously in alleviating her plantar fasciitis.  She is very satisfied with her current orthotics.  She presents for further treatment and evaluation  No past medical history on file.  No past surgical history on file.  No Known Allergies   Physical Exam: General: The patient is alert and oriented x3 in no acute distress.  Dermatology: Skin is warm, dry and supple bilateral lower extremities.   Vascular: Palpable pedal pulses bilaterally. Capillary refill within normal limits.  No appreciable edema.  No erythema.  Neurological: Grossly intact via light touch  Musculoskeletal Exam: No pedal deformities noted.  Today there is no tenderness or pain with palpation along the plantar fascia  Assessment/Plan of Care: 1.  Plantar fasciitis left  -Today the patient was molded for new custom orthotics -Continue good supportive tennis shoes and sneakers -Return to clinic 3 weeks orthotics.  *Physical therapist     Felecia Shelling, DPM [facility]  Dr. Felecia Shelling, DPM    [address] [place], [place] [address]                Office [phone]  Fax [phone]

## 2022-08-20 ENCOUNTER — Ambulatory Visit: Payer: BC Managed Care – PPO

## 2022-08-20 DIAGNOSIS — L989 Disorder of the skin and subcutaneous tissue, unspecified: Secondary | ICD-10-CM

## 2022-08-20 DIAGNOSIS — M722 Plantar fascial fibromatosis: Secondary | ICD-10-CM

## 2022-08-20 NOTE — Progress Notes (Unsigned)
Patient presents today to pick up custom orthotics   Patient was dispensed 1 pair of custom orthotics . Fit was not satisfactory.the arch is not high enough and the heel needs to be built up , I took pictures of the orthotics and will email them over to [facility] to have them adjusted .   Instructions for break-in and wear was reviewed and a copy was given to the patient.

## 2022-09-10 ENCOUNTER — Ambulatory Visit (INDEPENDENT_AMBULATORY_CARE_PROVIDER_SITE_OTHER): Payer: BC Managed Care – PPO

## 2022-09-10 NOTE — Progress Notes (Unsigned)
Orthotics still not fitting right remaking wider with MT pads, cork base neoprene cover  When in patient will come back for fitting   Beth Krause Cped, CFo, CFm

## 2022-10-22 ENCOUNTER — Other Ambulatory Visit: Payer: BC Managed Care – PPO

## 2022-11-02 ENCOUNTER — Telehealth: Payer: Self-pay | Admitting: Podiatry

## 2022-11-02 NOTE — Telephone Encounter (Signed)
Spoke with [facility] today tracking # provided [phone] [employer]  Shipped on 9/19 and may be held up due to [facility]

## 2022-11-02 NOTE — Telephone Encounter (Signed)
Pt called checking on status of orthotics. She is upset as this has been going on since June and she needs her orthotics. She is a [facility] pt.

## 2022-11-12 ENCOUNTER — Ambulatory Visit: Payer: BC Managed Care – PPO | Admitting: Podiatry

## 2022-11-15 ENCOUNTER — Ambulatory Visit: Payer: BC Managed Care – PPO | Attending: Physical Medicine & Rehabilitation | Admitting: Physical Therapy

## 2022-11-15 ENCOUNTER — Encounter: Payer: Self-pay | Admitting: Physical Therapy

## 2022-11-15 DIAGNOSIS — R262 Difficulty in walking, not elsewhere classified: Secondary | ICD-10-CM | POA: Insufficient documentation

## 2022-11-15 DIAGNOSIS — M5459 Other low back pain: Secondary | ICD-10-CM | POA: Insufficient documentation

## 2022-11-15 DIAGNOSIS — M5416 Radiculopathy, lumbar region: Secondary | ICD-10-CM | POA: Diagnosis present

## 2022-11-15 NOTE — Therapy (Unsigned)
OUTPATIENT PHYSICAL THERAPY THORACOLUMBAR EVALUATION   Patient Name: Beth Krause MRN: [id number] DOB:08-13-1960, 62 y.o., female Today's Date: 11/15/2022  END OF SESSION:  PT End of Session - 11/17/22 0604     Visit Number 1    Number of Visits 13    Date for PT Re-Evaluation 12/30/22    Authorization Type [employer], VL 60 combined PT/OT, plan year 05/03/22-05/02/23    PT Start Time 1415    PT Stop Time 1458    PT Time Calculation (min) 43 min    Behavior During Therapy [facility] for tasks assessed/performed              History reviewed. No pertinent past medical history. History reviewed. No pertinent surgical history. 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 Mariah Milling 11/25/22   SUBJECTIVE:                                                                                                                                                                                         SUBJECTIVE STATEMENT:  Pt is a 62 year old female referred for back and radicular pain secondary to disc herniation and stenosis. She is status post right L3 L2-3  and left L3-4 TFESI 10/11/2022 with temporary improvement.   PERTINENT HISTORY: Pt is a 62 year old female referred for back and radicular pain secondary to disc herniation and stenosis. She is status post right L3 L2-3 and left L3-4 TFESI 10/11/2022 with temporary improvement. Pain is worse with standing and walking and better with sitting. Hx of PT in May to June without much improvement. Pt works as PT - has worked for 35 years.   Pt reports symptoms started in beginning of May. Pt reports back pain episode in 1994 in with MRI showing slight disc bulge. Pt reports walking up to 4-5 mi per day usually and  completing elliptical/spin bike. She reports insidious atraumatic onset of pain in back with pain into R buttock region and R leg. Pt started with [facility]. Pt had 2 sets of ESI at L3-4 and L4-5 without resolution of symptoms. 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 Patient has to perform MaxA transfers at work. She has difficulty with long-distance walking. Pt reports constant feeling of stiffness. Pt reports no disturbed sleep due to her pain. No unexplained weight change. No benefit from Tramadol, muscle relaxers. Pt denies changes in bathroom habits.Pt reports continuous tingling along L3 distribution.  Pt reports that lumbar roll does not work usually. Pt reports feeling good in prone/prone on elbows.   PAIN:    Pain Intensity: Present: 0/10, Best: 0/10, Worst: 10/10 Pain location: R buttock and down to R thigh/distomedial R thigh; intermittent referral to L side of back Pain Quality: Sharp pain in back, tingling into thigh region  Radiating: Yes , radiating to buttock and R thigh Numbness/Tingling: Yes; R anterior thigh  Focal Weakness: No Aggravating factors: prolonged walking, getting out of bed first thing, prolonged standing/static standing  Relieving factors: on the move, prone/prone on elbows 24-hour pain behavior: worse in AM, bad in evening; better with movement  in middle of day How long can you stand: No more than a couple of minutes History of prior back injury, pain, surgery, or therapy: Yes; one injury in 90s that resolved without treatment   Imaging: Yes , MRI 08/26/22  IMPRESSION: 1.  Moderate spinal stenosis at L3-L4 with bilateral foraminal disc protrusions causing severe right neural foraminal stenosis. Modic type I endplate changes at this level. 2.  6 x 8 mm synovial cyst on the left at L5-S1 abutting the traversing S1 nerve root. 3.  Right facet capsular distention from L2-L3 to L4-L5. Increase T2 signal in the right L3 and L4 pedicles may be reactive to acute stress related changes.    Red flags: Negative for bowel/bladder changes, saddle paresthesia, personal history of cancer, h/o spinal tumors, h/o compression fx, h/o abdominal aneurysm, abdominal pain, chills/fever, night sweats, nausea, vomiting, unrelenting pain, first onset of insidious LBP <20 y/o  PRECAUTIONS: None  WEIGHT BEARING RESTRICTIONS: No  FALLS: Has patient fallen in last 6 months? 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  Recent memory is intact.  Remote memory is intact.  Attention span and concentration are intact.  Expressive speech is intact.  Patient's fund of knowledge is within normal limits for educational level.    Gross Musculoskeletal Assessment Tremor: None Bulk: Normal Tone: Normal No visible step-off along spinal column, no signs of  scoliosis  GAIT: Distance walked: 40 ft Assistive device utilized: None Level of assistance: Complete Independence Comments: Symmetrical weightbearing, upright guarded posture   Posture: Lumbar lordosis: WNL Iliac crest height: Equal bilaterally Lumbar lateral shift: to L side   AROM AROM (Normal range in degrees) AROM   Lumbar   Flexion (65) 75% (pull in hamstrings)  Extension (30) 50% ("usually that hurts")  Right lateral flexion (25) 50%* (leg  pain)  Left lateral flexion (25) 50%  Right rotation (30) 75%  Left rotation (30) WNL      Hip Right Left  Flexion (125)    Extension (15)    Abduction (40)    Adduction     Internal Rotation (45)    External Rotation (45)        (* = pain; Blank rows = not tested)  LE MMT: MMT (out of 5) Right  Left   Hip flexion    Hip extension    Hip abduction    Hip adduction    Hip internal rotation    Hip external rotation    Knee flexion    Knee extension    Ankle dorsiflexion    Ankle plantarflexion    Ankle inversion    Ankle eversion    (* = pain; Blank rows = not tested)  Sensation Dec light touch R anterior thigh   Reflexes R/L Knee Jerk (L3/4): 2+/2+  Ankle Jerk (S1/2): 2+/2+  UMN signs (-)  Muscle Length Hamstrings: R: Positive L: Negative E[place] (quadriceps): R: Not examined L: Not examined Thomas (hip flexors): R: Not examined L: Not examined   Palpation Location Right Left         Lumbar paraspinals 1 0  Quadratus Lumborum    Gluteus Maximus 1 0  Gluteus Medius    Deep hip external rotators    PSIS 1   Fortin's Area (SIJ) 0   Greater Trochanter 0   (Blank rows = not tested) Graded on 0-4 scale (0 = no pain, 1 = pain, 2 = pain with wincing/grimacing/flinching, 3 = pain with withdrawal, 4 = unwilling to allow palpation)  Passive Accessory Intervertebral Motion Deferred to next visit   Special Tests Lumbar Radiculopathy and Discogenic: Slump (SN 83, -LR 0.32): R: Positive for L-sided back pain;   L: Positive for L-sided back pain SLR (SN 92, -LR 0.29): R: Negative L:  Negative  Facet Joint: Extension-Rotation (SN 100, -LR 0.0): R: Positive L: Positive  Lumbar Foraminal Stenosis: Lumbar quadrant (SN 70): R: Positive L: Positive   -No symptom reduction with hooklying general manual lumbar traction    TODAY'S TREATMENT: DATE: 11/15/22     Therapeutic Exercise - for HEP establishment, discussion on appropriate exercise/activity modification, PT education   Reviewed baseline home exercises and provided handout for [facility] program (see Access Code); tactile cueing and therapist demonstration utilized as needed for carryover of proper technique to HEP. We reviewed expectations for potential centralization of symptoms with exercise.   Patient education on current condition, anatomy involved, prognosis, plan of care. Discussion on activity modification to prevent flare-up of condition, including avoidance of slouching, stooping, and repeated lumbar flexion at this time, use of positional traction for L-spine, and suggested modifications for lying/sitting positions.    PATIENT EDUCATION:  Education details: see above for patient education details Person educated: Patient Education method: Explanation, Demonstration, and Handouts Education comprehension: verbalized understanding and returned demonstration   HOME EXERCISE PROGRAM:  Access Code: [email or web address] URL: [email or web address] Date: 11/16/2022 Prepared by: Consuela Mimes  Exercises - Prone Press Up  - 5-6 x daily - 7 x weekly - 2 sets - 10 reps - 1sec hold - Hooklying Lumbar Traction  - 7 x weekly - 10 reps - 5-10sec hold   ASSESSMENT:  CLINICAL IMPRESSION: Patient is a 62 y.o. female who was seen today for physical therapy evaluation and treatment for R-sided low back pain with RLE referred pain with negative  SLR, but positive SLUMP testing, extension-rotation, and lumbar quadrant testing. Clinical  presentation is consistent with lumbar radiculopathy and possible lumbar spinal stenosis contributing to current condition. Subjective exam does reflect potential extension aversion, but pt reports most relief of pain with prone lying/prone on elbows. We explored repeated extension to uncover potential flexion bias or ascertain benefit of extension program. Pt has negative red flag screen. Pt presents with current impairments in: thoracolumbar AROM, posterior chain soft tissue extensibility/dural mobility, R-sided back pain with R lower limb referral pattern, and mild tautness/tenderness of R lumbar paraspinals/gluteal mm. Pt will continue to benefit from skilled PT services to address deficits and improve function.   OBJECTIVE IMPAIRMENTS: decreased mobility, difficulty walking, decreased ROM, decreased strength, impaired flexibility, and pain.   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   REHAB POTENTIAL: Good  CLINICAL DECISION MAKING: Evolving/moderate complexity  EVALUATION COMPLEXITY: Moderate   GOALS: Goals reviewed with patient? Yes  SHORT TERM GOALS: Target date: 12/06/2022  Pt will be independent with HEP in order to improve strength and decrease back pain to improve pain-free function at home and work. Baseline: 11/15/22: Baseline HEP initiated.  Goal status: INITIAL   LONG TERM GOALS: Target date: 12/30/2022  Pt will increase FOTO to at least 61 to demonstrate significant improvement in function at home and work related to back pain  Baseline: 11/15/22: 46 Goal status: INITIAL  2.  By 4 weeks, will decrease  worst back pain by at least 2 points on the NPRS in order to demonstrate clinically significant reduction in back pain. Baseline: 11/15/22: 10/10 pain at worst Goal status: INITIAL  3.  Pt will complete standing/ambulatory activity up to half of her work day without exacerbation of back pain and NPRS < 2/10 as needed for community-level mobility and completion of work duties Baseline: 11/15/22: Difficulty standing more than a couple of minutes Goal status: INITIAL  4.  Patient will have full thoracolumbar AROM without reproduction of pain as needed for reaching items on ground, household chores, bending. Baseline: 11/15/22: Motion loss into flexion, extension, R/L lateral flexion, R rotation.  Goal status: INITIAL  5.  By completion of POC, will decrease worst back pain to no more than 2-3/10 on the NPRS in order to demonstrate clinically significant reduction in back pain and improved tolerance of work duties and community-level functional mobility. Baseline: 11/15/22: 10/10 pain at worst Goal status: INITIAL   PLAN: PT FREQUENCY: 1-2x/week  PT DURATION: 6 weeks  PLANNED INTERVENTIONS: Therapeutic exercises, Therapeutic activity, Neuromuscular re-education, Balance training, Gait training, Patient/Family education, Self Care, Joint mobilization, Joint manipulation, Vestibular training, Canalith repositioning, Orthotic/Fit training, DME instructions, Dry Needling, Electrical stimulation, Spinal manipulation, Spinal mobilization, Cryotherapy, Moist heat, Taping, Traction, Ultrasound, Ionotophoresis 4mg /ml Dexamethasone, Manual therapy, and Re-evaluation.  PLAN FOR NEXT SESSION: Assess response to extension in lying/extension principle intervention. Complete myotome screen/MMT. Update HEP for neurodynamics and gentle thoracolumbar ROM. Manual techniques for symptom modulation prn. Check CPAs/accessory mobility. Progress with extension-bias exercise pending response with extension as primary  movement.     Consuela Mimes, PT, DPT #[id number]  Gertie Exon, PT 11/17/2022, 6:04 AM

## 2022-11-17 ENCOUNTER — Ambulatory Visit: Payer: BC Managed Care – PPO | Admitting: Physical Therapy

## 2022-11-17 DIAGNOSIS — M5416 Radiculopathy, lumbar region: Secondary | ICD-10-CM | POA: Diagnosis not present

## 2022-11-17 DIAGNOSIS — M5459 Other low back pain: Secondary | ICD-10-CM

## 2022-11-17 DIAGNOSIS — R262 Difficulty in walking, not elsewhere classified: Secondary | ICD-10-CM

## 2022-11-17 NOTE — Therapy (Signed)
OUTPATIENT PHYSICAL THERAPY TREATMENT   Patient Name: Beth Krause MRN: [id number] DOB:1960-09-03, 62 y.o., female Today's Date: 11/17/2022   END OF SESSION:  PT End of Session - 11/17/22 1403     Visit Number 2    Number of Visits 13    Date for PT Re-Evaluation 12/30/22    Authorization Type [employer], VL 60 combined PT/OT, plan year 05/03/22-05/02/23    PT Start Time 1406    PT Stop Time 1458    PT Time Calculation (min) 52 min    Behavior During Therapy [facility] for tasks assessed/performed               No past medical history on file. No past surgical history on file. 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 Mariah Milling 11/25/22  PERTINENT HISTORY: Pt is a 62 year old female referred for back and radicular pain secondary to disc herniation and stenosis. She is status post right L3 L2-3 and left L3-4 TFESI 10/11/2022 with temporary improvement. Pain is worse with standing and walking and better with sitting. Hx of PT in May to June without much improvement. Pt works as PT - has worked for 35 years.   Pt reports symptoms started in  beginning of May. Pt reports back pain episode in 1994 in with MRI showing slight disc bulge. Pt reports walking up to 4-5 mi per day usually and completing elliptical/spin bike. She reports insidious atraumatic onset of pain in back with pain into R buttock region and R leg. Pt started with [facility]. Pt had 2 sets of ESI at L3-4 and L4-5 without resolution of symptoms. 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 Patient has to perform MaxA transfers at work. She has difficulty with long-distance walking. Pt reports constant feeling of stiffness. Pt reports no disturbed sleep due to her pain. No unexplained weight change. No benefit from Tramadol, muscle relaxers. Pt denies changes in bathroom habits.Pt reports continuous tingling along L3 distribution.  Pt reports that lumbar roll does not work usually. Pt reports feeling good in prone/prone on elbows.   PAIN:    Pain Intensity: Present: 0/10, Best: 0/10, Worst: 10/10 Pain location: R buttock and down to R thigh/distomedial R thigh; intermittent referral to L side of back Pain Quality: Sharp pain in back, tingling into thigh region  Radiating: Yes , radiating to buttock and R thigh Numbness/Tingling: Yes; R anterior thigh  Focal Weakness: No Aggravating factors: prolonged walking, getting out of bed first thing, prolonged standing/static standing  Relieving factors: on the move, prone/prone on elbows 24-hour pain behavior: worse in AM, bad in evening; better with movement in middle of day How long can you  stand: No more than a couple of minutes History of prior back injury, pain, surgery, or therapy: Yes; one injury in 90s that resolved without treatment   Imaging: Yes , MRI 08/26/22  IMPRESSION: 1.  Moderate spinal stenosis at L3-L4 with bilateral foraminal disc protrusions causing severe right neural foraminal stenosis. Modic type I endplate changes at this level. 2.  6 x 8 mm synovial cyst on the left at L5-S1 abutting  the traversing S1 nerve root. 3.  Right facet capsular distention from L2-L3 to L4-L5. Increase T2 signal in the right L3 and L4 pedicles may be reactive to acute stress related changes.    Red flags: Negative for bowel/bladder changes, saddle paresthesia, personal history of cancer, h/o spinal tumors, h/o compression fx, h/o abdominal aneurysm, abdominal pain, chills/fever, night sweats, nausea, vomiting, unrelenting pain, first onset of insidious LBP <20 y/o  PRECAUTIONS: None  WEIGHT BEARING RESTRICTIONS: No  FALLS: Has patient fallen in last 6 months? 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   Hobbies: Walking exercise, elliptical exercise routine  Patient Goals: Able to complete walking exercise pain-free; able to stand at public venues/publuc outings    OBJECTIVE:  GAIT: Distance walked: 40 ft Assistive device utilized: None Level of assistance: Complete Independence Comments: Symmetrical weightbearing, upright guarded posture   Posture: Lumbar lordosis: WNL Iliac crest height: Equal bilaterally Lumbar lateral shift: to L side   AROM AROM (Normal range in degrees) AROM   Lumbar   Flexion (65) 75% (pull in hamstrings)  Extension (30) 50% ("usually that hurts")  Right lateral flexion (25) 50%* (leg pain)  Left lateral flexion (25) 50%  Right rotation (30) 75%  Left rotation (30) WNL      Hip Right Left  Flexion (125)    Extension (15)    Abduction (40)    Adduction     Internal Rotation (45)    External Rotation (45)        (* = pain; Blank rows = not tested)  LE MMT: MMT (out of 5) Right  Left   Hip flexion    Hip extension    Hip abduction    Hip adduction    Hip internal rotation    Hip external rotation    Knee flexion    Knee extension    Ankle  dorsiflexion    Ankle plantarflexion    Ankle inversion    Ankle eversion    (* = pain; Blank rows = not tested)  Sensation Dec light touch R anterior thigh   Muscle Length Hamstrings: R: Positive L: Negative Ely (quadriceps): R: Not examined L: Not examined Thomas (hip flexors): R: Not examined L: Not examined   Palpation Location Right Left         Lumbar paraspinals 1 0  Quadratus Lumborum    Gluteus Maximus 1 0  Gluteus Medius    Deep hip external rotators    PSIS 1   Fortin's Area (SIJ) 0   Greater Trochanter 0   (Blank rows = not tested) Graded on 0-4 scale (0 = no pain, 1 = pain, 2 = pain with wincing/grimacing/flinching, 3 = pain with withdrawal, 4 = unwilling to allow palpation)   Passive Accessory Intervertebral Motion 11/17/22: Hypomobility with CPA L4-S1 and T7-T12. Pain prior to restriction at lower lumbar segments.  Special Tests Lumbar Radiculopathy and Discogenic: Slump (SN 83, -LR 0.32): R: Positive for L-sided back pain;  L: Positive for L-sided back pain SLR (SN 92, -LR 0.29): R: Negative L:  Negative  Facet Joint: Extension-Rotation (SN 100, -LR 0.0): R: Positive L: Positive  Lumbar Foraminal Stenosis: Lumbar quadrant (SN 70): R: Positive L: Positive   -No symptom reduction with hooklying general manual lumbar traction    TODAY'S TREATMENT: DATE: 11/17/2022    SUBJECTIVE STATEMENT:   Patient reports doing well with repeated extension - unable to complete at work. Pt reports ongoing tingling affecting R thigh at arrival. Patient reports prone position is more comfortable - she can't say if it's better. Patient reports highly variable symptoms; up to 10/10 earlier and around 6-7/10 at arrival.     Manual Therapy - for symptom modulation, soft tissue sensitivity and mobility, joint mobility, ROM   Bilateral long-leg distraction for nerve root decompression, with Mulligan belt, supine; 10 sec intermittent holds; x 8 minutes Unilateral R  long-leg distraction for symptom modulation/nerve root decompression; x 5 minutes STM and IASTM with Hypervolt, R lumbar erector spine L3-S1 and R gluteus maximus/medius; x 8 minutes    Therapeutic Exercise - for improved soft tissue flexibility and extensibility as needed for ROM, improved strength as needed to improve performance of CKC activities/functional movements  Repeated extension in lying; 2x10 Supine sciatic nerve flossing; 2x10 on each side Supine piriformis stretch; x 30 sec on each side  -for HEP update  PATIENT EDUCATION: HEP update and review. Discussed strategies for positional relief of symptoms and alternatives to self-traction. We reviewed neurodynamics and principles of MDT intervention.     PATIENT EDUCATION:  Education details: see above for patient education details Person educated: Patient Education method: Explanation, Demonstration, and Handouts Education comprehension: verbalized understanding and returned demonstration   HOME EXERCISE PROGRAM:  Access Code: [email or web address] URL: [email or web address] Date: 11/17/2022 Prepared by: Consuela Mimes  Exercises - Prone Press Up  - 5-6 x daily - 7 x weekly - 2 sets - 10 reps - 1sec hold - Hooklying Lumbar Traction  - 7 x weekly - 10 reps - 5-10sec hold - Supine 90/90 Sciatic Nerve Glide with Knee Flexion/Extension  - 2 x daily - 7 x weekly - 2 sets - 10 reps - 1-2sec hold - Supine Piriformis Stretch with Foot on Ground  - 2 x daily - 7 x weekly - 3 sets - 30sec hold   ASSESSMENT:  CLINICAL IMPRESSION: Patient has good response to traction and use of repeated extension. Pt unfortunately has rapid return of symptoms upon walking/stepping and frequent return of R lower quarter and RLE symptoms with activity. We updated her program for neurodynamics and gluteal stretching. Pt tolerates additional exercises and manual therapy well. We may consider modification to patient's primary repeated movement if  not getting sufficient progress with EIL. Pt presents with current impairments in: thoracolumbar AROM, posterior chain soft tissue extensibility/dural mobility, R-sided back pain with R lower limb referral pattern, and mild tautness/tenderness of R lumbar paraspinals/gluteal mm. Pt will continue to benefit from skilled PT services to address deficits and improve function.   OBJECTIVE IMPAIRMENTS: decreased mobility, difficulty walking, decreased ROM, decreased strength, impaired flexibility, and pain.   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   REHAB POTENTIAL: Good  CLINICAL DECISION MAKING: Evolving/moderate complexity  EVALUATION COMPLEXITY: Moderate   GOALS: Goals reviewed with patient? Yes  SHORT TERM GOALS: Target date: 12/06/2022  Pt will be independent with HEP in order to improve strength and decrease back pain to improve pain-free function at home and work. Baseline: 11/15/22: Baseline HEP initiated.  Goal status: INITIAL   LONG TERM GOALS: Target date: 12/30/2022  Pt will increase FOTO to at least 61 to demonstrate significant improvement in function at home and work related to back pain  Baseline: 11/15/22: 46 Goal status: INITIAL  2.  By 4 weeks, will decrease worst back pain by at least 2 points on the NPRS in order to demonstrate clinically significant reduction in back pain. Baseline: 11/15/22: 10/10 pain at worst Goal status: INITIAL  3.  Pt will complete standing/ambulatory activity up to half of her work day without exacerbation of back pain and NPRS < 2/10 as needed for community-level mobility  and completion of work duties Baseline: 11/15/22: Difficulty standing more than a couple of minutes Goal status: INITIAL  4.  Patient will have full thoracolumbar AROM without reproduction of pain as needed for reaching items on ground, household chores, bending. Baseline: 11/15/22: Motion loss into flexion, extension, R/L lateral flexion, R rotation.  Goal status: INITIAL  5.  By completion of POC, will decrease worst back pain to no more than 2-3/10 on the NPRS in order to demonstrate clinically significant reduction in back pain and improved tolerance of work duties and community-level functional mobility. Baseline: 11/15/22: 10/10 pain at worst Goal status: INITIAL   PLAN: PT FREQUENCY: 1-2x/week  PT DURATION: 6 weeks  PLANNED INTERVENTIONS: Therapeutic exercises, Therapeutic activity, Neuromuscular re-education, Balance training, Gait training, Patient/Family education, Self Care, Joint mobilization, Joint manipulation, Vestibular training, Canalith repositioning, Orthotic/Fit training, DME instructions, Dry Needling, Electrical stimulation, Spinal manipulation, Spinal mobilization, Cryotherapy, Moist heat, Taping, Traction, Ultrasound, Ionotophoresis 4mg /ml Dexamethasone, Manual therapy, and Re-evaluation.  PLAN FOR NEXT SESSION: Complete myotome screen/MMT.  Manual techniques for symptom modulation prn, bilateral/unilateral distraction. Progress with extension-bias exercise pending response with extension as primary movement.     Consuela Mimes, PT, DPT #[id number]  Gertie Exon, PT 11/17/2022, 3:02 PM

## 2022-11-19 ENCOUNTER — Ambulatory Visit: Payer: BC Managed Care – PPO

## 2022-11-19 NOTE — Progress Notes (Signed)
Patient was fit today with re-make orthotics  Patient is happier with product and will try if she likes will order another pair  Half off / six months and then will have old pair refurbished  I also scanned patient with [employer] app for cork / firm orthotics  Marsel Gail Cped, CFo, Cfm

## 2022-11-22 ENCOUNTER — Ambulatory Visit: Payer: BC Managed Care – PPO | Admitting: Physical Therapy

## 2022-11-22 DIAGNOSIS — M5416 Radiculopathy, lumbar region: Secondary | ICD-10-CM | POA: Diagnosis not present

## 2022-11-22 DIAGNOSIS — M5459 Other low back pain: Secondary | ICD-10-CM

## 2022-11-22 DIAGNOSIS — R262 Difficulty in walking, not elsewhere classified: Secondary | ICD-10-CM

## 2022-11-22 NOTE — Therapy (Deleted)
OUTPATIENT PHYSICAL THERAPY TREATMENT   Patient Name: Beth Krause MRN: [id number] DOB:Sep 09, 1960, 62 y.o., female Today's Date: 11/22/2022   END OF SESSION:      No past medical history on file. No past surgical history on file. 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 Mariah Milling 11/25/22  PERTINENT HISTORY: Pt is a 62 year old female referred for back and radicular pain secondary to disc herniation and stenosis. She is status post right L3 L2-3 and left L3-4 TFESI 10/11/2022 with temporary improvement. Pain is worse with standing and walking and better with sitting. Hx of PT in May to June without much improvement. Pt works as PT - has worked for 35 years.   Pt reports symptoms started in beginning of May. Pt reports back pain episode in 1994 in with MRI showing slight disc bulge. Pt reports walking up to 4-5 mi per day usually and completing elliptical/spin bike. She reports insidious atraumatic onset of pain in back with pain into R buttock region and R leg. Pt started with [facility]. Pt had 2 sets of ESI at L3-4 and L4-5 without  resolution of symptoms. 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 Patient has to perform MaxA transfers at work. She has difficulty with long-distance walking. Pt reports constant feeling of stiffness. Pt reports no disturbed sleep due to her pain. No unexplained weight change. No benefit from Tramadol, muscle relaxers. Pt denies changes in bathroom habits.Pt reports continuous tingling along L3 distribution.  Pt reports that lumbar roll does not work usually. Pt reports feeling good in prone/prone on elbows.   PAIN:    Pain Intensity: Present: 0/10, Best: 0/10, Worst: 10/10 Pain location: R buttock and down to R thigh/distomedial R thigh; intermittent referral to L side of back Pain Quality: Sharp pain in back, tingling into thigh region  Radiating: Yes , radiating to buttock and R thigh Numbness/Tingling: Yes; R anterior thigh  Focal Weakness: No Aggravating factors: prolonged walking, getting out of bed first thing, prolonged standing/static standing  Relieving factors: on the move, prone/prone on elbows 24-hour pain behavior: worse in AM, bad in evening; better with movement in middle of day How long can you stand: No more than a couple of minutes History of prior back injury, pain, surgery, or therapy: Yes; one injury in 90s that resolved without treatment   Imaging: Yes , MRI 08/26/22  IMPRESSION: 1.  Moderate spinal stenosis at L3-L4 with bilateral foraminal disc protrusions causing severe right neural foraminal stenosis. Modic type I endplate changes at this level. 2.  6 x 8 mm synovial cyst on the left at L5-S1 abutting the traversing S1 nerve root. 3.  Right facet capsular  distention from L2-L3 to L4-L5. Increase T2 signal in the right L3 and L4 pedicles may be reactive to acute stress related changes.    Red flags: Negative for bowel/bladder changes, saddle paresthesia, personal history of cancer, h/o spinal tumors, h/o compression fx, h/o abdominal  aneurysm, abdominal pain, chills/fever, night sweats, nausea, vomiting, unrelenting pain, first onset of insidious LBP <20 y/o  PRECAUTIONS: None  WEIGHT BEARING RESTRICTIONS: No  FALLS: Has patient fallen in last 6 months? 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   Hobbies: Walking exercise, elliptical exercise routine  Patient Goals: Able to complete walking exercise pain-free; able to stand at public venues/publuc outings    OBJECTIVE:  GAIT: Distance walked: 40 ft Assistive device utilized: None Level of assistance: Complete Independence Comments: Symmetrical weightbearing, upright guarded posture   Posture: Lumbar lordosis: WNL Iliac crest height: Equal bilaterally Lumbar lateral shift: to L side   AROM AROM (Normal range in degrees) AROM   Lumbar   Flexion (65) 75% (pull in hamstrings)  Extension (30) 50% ("usually that hurts")  Right lateral flexion (25) 50%* (leg pain)  Left lateral flexion (25) 50%  Right rotation (30) 75%  Left rotation (30) WNL      Hip Right Left  Flexion (125)    Extension (15)    Abduction (40)    Adduction     Internal Rotation (45)    External Rotation (45)        (* = pain; Blank rows = not tested)  LE MMT: MMT (out of 5) Right  Left   Hip flexion    Hip extension    Hip abduction    Hip adduction    Hip internal rotation    Hip external rotation    Knee flexion    Knee extension    Ankle dorsiflexion    Ankle plantarflexion    Ankle inversion    Ankle eversion    (* = pain; Blank rows = not tested)  Sensation Dec light touch R anterior thigh   Muscle Length Hamstrings: R: Positive L: Negative E[place] (quadriceps): R: Not examined L: Not examined Thomas (hip flexors): R: Not examined L: Not  examined   Palpation Location Right Left         Lumbar paraspinals 1 0  Quadratus Lumborum    Gluteus Maximus 1 0  Gluteus Medius    Deep hip external rotators    PSIS 1   Fortin's Area (SIJ) 0   Greater Trochanter 0   (Blank rows = not tested) Graded on 0-4 scale (0 = no pain, 1 = pain, 2 = pain with wincing/grimacing/flinching, 3 = pain with withdrawal, 4 = unwilling to allow palpation)   Passive Accessory Intervertebral Motion 11/17/22: Hypomobility with CPA L4-S1 and T7-T12. Pain prior to restriction at lower lumbar segments.    Special Tests Lumbar Radiculopathy and Discogenic: Slump (SN 83, -LR 0.32): R: Positive for L-sided back pain;  L: Positive for L-sided back pain SLR (SN 92, -LR 0.29): R: Negative L:  Negative  Facet Joint: Extension-Rotation (SN 100, -LR 0.0): R: Positive L: Positive  Lumbar Foraminal Stenosis: Lumbar quadrant (SN 70): R: Positive L: Positive   -No symptom reduction with hooklying general manual lumbar traction    TODAY'S TREATMENT: DATE: 11/22/2022    SUBJECTIVE STATEMENT:   Patient reports  doing well with repeated extension - unable to complete at work. Pt reports ongoing tingling affecting R thigh at arrival. Patient reports prone position is more comfortable - she can't say if it's better. Patient reports highly variable symptoms; up to 10/10 earlier and around 6-7/10 at arrival.     Manual Therapy - for symptom modulation, soft tissue sensitivity and mobility, joint mobility, ROM   Bilateral long-leg distraction for nerve root decompression, with Mulligan belt, supine; 10 sec intermittent holds; x 8 minutes Unilateral R long-leg distraction for symptom modulation/nerve root decompression; x 5 minutes STM and IASTM with Hypervolt, R lumbar erector spine L3-S1 and R gluteus maximus/medius; x 8 minutes    Therapeutic Exercise - for improved soft tissue flexibility and extensibility as needed for ROM, improved strength as needed  to improve performance of CKC activities/functional movements  Repeated extension in lying; 2x10 Supine sciatic nerve flossing; 2x10 on each side Supine piriformis stretch; x 30 sec on each side  -for HEP update  PATIENT EDUCATION: HEP update and review. Discussed strategies for positional relief of symptoms and alternatives to self-traction. We reviewed neurodynamics and principles of MDT intervention.     PATIENT EDUCATION:  Education details: see above for patient education details Person educated: Patient Education method: Explanation, Demonstration, and Handouts Education comprehension: verbalized understanding and returned demonstration   HOME EXERCISE PROGRAM:  Access Code: [email or web address] URL: [email or web address] Date: 11/17/2022 Prepared by: Consuela Mimes  Exercises - Prone Press Up  - 5-6 x daily - 7 x weekly - 2 sets - 10 reps - 1sec hold - Hooklying Lumbar Traction  - 7 x weekly - 10 reps - 5-10sec hold - Supine 90/90 Sciatic Nerve Glide with Knee Flexion/Extension  - 2 x daily - 7 x weekly - 2 sets - 10 reps - 1-2sec hold - Supine Piriformis Stretch with Foot on Ground  - 2 x daily - 7 x weekly - 3 sets - 30sec hold   ASSESSMENT:  CLINICAL IMPRESSION: Patient has good response to traction and use of repeated extension. Pt unfortunately has rapid return of symptoms upon walking/stepping and frequent return of R lower quarter and RLE symptoms with activity. We updated her program for neurodynamics and gluteal stretching. Pt tolerates additional exercises and manual therapy well. We may consider modification to patient's primary repeated movement if not getting sufficient progress with EIL. Pt presents with current impairments in: thoracolumbar AROM, posterior chain soft tissue extensibility/dural mobility, R-sided back pain with R lower limb referral pattern, and mild tautness/tenderness of R lumbar paraspinals/gluteal mm. Pt will continue to benefit from  skilled PT services to address deficits and improve function.   OBJECTIVE IMPAIRMENTS: decreased mobility, difficulty walking, decreased ROM, decreased strength, impaired flexibility, and pain.   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   REHAB POTENTIAL: Good  CLINICAL DECISION MAKING: Evolving/moderate complexity  EVALUATION COMPLEXITY: Moderate   GOALS: Goals reviewed with patient? Yes  SHORT TERM GOALS: Target date: 12/06/2022  Pt will be independent with HEP in order to improve strength and decrease back pain to improve pain-free function at home and work. Baseline: 11/15/22: Baseline HEP initiated.  Goal status: INITIAL   LONG TERM GOALS: Target date: 12/30/2022  Pt will increase FOTO to  at least 61 to demonstrate significant improvement in function at home and work related to back pain  Baseline: 11/15/22: 46 Goal status: INITIAL  2.  By 4 weeks, will decrease worst back pain by at least 2 points on the NPRS in order to demonstrate clinically significant reduction in back pain. Baseline: 11/15/22: 10/10 pain at worst Goal status: INITIAL  3.  Pt will complete standing/ambulatory activity up to half of her work day without exacerbation of back pain and NPRS < 2/10 as needed for community-level mobility and completion of work duties Baseline: 11/15/22: Difficulty standing more than a couple of minutes Goal status: INITIAL  4.  Patient will have full thoracolumbar AROM without reproduction of pain as needed for reaching items on ground, household chores, bending. Baseline: 11/15/22: Motion loss into flexion,  extension, R/L lateral flexion, R rotation.  Goal status: INITIAL  5.  By completion of POC, will decrease worst back pain to no more than 2-3/10 on the NPRS in order to demonstrate clinically significant reduction in back pain and improved tolerance of work duties and community-level functional mobility. Baseline: 11/15/22: 10/10 pain at worst Goal status: INITIAL   PLAN: PT FREQUENCY: 1-2x/week  PT DURATION: 6 weeks  PLANNED INTERVENTIONS: Therapeutic exercises, Therapeutic activity, Neuromuscular re-education, Balance training, Gait training, Patient/Family education, Self Care, Joint mobilization, Joint manipulation, Vestibular training, Canalith repositioning, Orthotic/Fit training, DME instructions, Dry Needling, Electrical stimulation, Spinal manipulation, Spinal mobilization, Cryotherapy, Moist heat, Taping, Traction, Ultrasound, Ionotophoresis 4mg /ml Dexamethasone, Manual therapy, and Re-evaluation.  PLAN FOR NEXT SESSION: Complete myotome screen/MMT.  Manual techniques for symptom modulation prn, bilateral/unilateral distraction. Progress with extension-bias exercise pending response with extension as primary movement.     Consuela Mimes, PT, DPT #[id number]  Gertie Exon, PT 11/22/2022, 12:19 PM

## 2022-11-22 NOTE — Therapy (Unsigned)
OUTPATIENT PHYSICAL THERAPY TREATMENT   Patient Name: Beth Krause MRN: [id number] DOB:Apr 02, 1960, 62 y.o., female Today's Date: 11/22/2022   END OF SESSION:  PT End of Session - 11/24/22 1221     Visit Number 3    Number of Visits 13    Date for PT Re-Evaluation 12/30/22    Authorization Type [employer], VL 60 combined PT/OT, plan year 05/03/22-05/02/23    PT Start Time 1715    PT Stop Time 1803    PT Time Calculation (min) 48 min    Behavior During Therapy [facility] for tasks assessed/performed                History reviewed. No pertinent past medical history. History reviewed. No pertinent surgical history. 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 Mariah Milling 11/25/22  PERTINENT HISTORY: Pt is a 62 year old female referred for back and radicular pain secondary to disc herniation and stenosis. She is status post right L3 L2-3 and left L3-4 TFESI 10/11/2022 with temporary improvement. Pain is worse with standing and walking and better with sitting. Hx of PT in May to June without much improvement. Pt works as PT - has worked for 35 years.    Pt reports symptoms started in beginning of May. Pt reports back pain episode in 1994 in with MRI showing slight disc bulge. Pt reports walking up to 4-5 mi per day usually and completing elliptical/spin bike. She reports insidious atraumatic onset of pain in back with pain into R buttock region and R leg. Pt started with [facility]. Pt had 2 sets of ESI at L3-4 and L4-5 without resolution of symptoms. 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 Patient has to perform MaxA transfers at work. She has difficulty with long-distance walking. Pt reports constant feeling of stiffness. Pt reports no disturbed sleep due to her pain. No unexplained weight change. No benefit from Tramadol, muscle relaxers. Pt denies changes in bathroom habits.Pt reports continuous tingling along L3 distribution.  Pt reports that lumbar roll does not work usually. Pt reports feeling good in prone/prone on elbows.   PAIN:    Pain Intensity: Present: 0/10, Best: 0/10, Worst: 10/10 Pain location: R buttock and down to R thigh/distomedial R thigh; intermittent referral to L side of back Pain Quality: Sharp pain in back, tingling into thigh region  Radiating: Yes , radiating to buttock and R thigh Numbness/Tingling: Yes; R anterior thigh  Focal Weakness: No Aggravating factors: prolonged walking, getting out of bed first thing, prolonged standing/static standing  Relieving factors: on the move, prone/prone on elbows 24-hour pain behavior: worse in AM, bad in evening; better with movement in middle of day How long  can you stand: No more than a couple of minutes History of prior back injury, pain, surgery, or therapy: Yes; one injury in 90s that resolved without treatment   Imaging: Yes , MRI 08/26/22  IMPRESSION: 1.  Moderate spinal stenosis at L3-L4 with bilateral foraminal disc protrusions causing severe right neural foraminal stenosis. Modic type I endplate changes at this level. 2.  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 3.  Right facet capsular distention from L2-L3 to L4-L5. Increase T2 signal in the right L3 and L4 pedicles may be reactive to acute stress related changes.    Red flags: Negative for bowel/bladder changes, saddle paresthesia, personal history of cancer, h/o spinal tumors, h/o compression fx, h/o abdominal aneurysm, abdominal pain, chills/fever, night sweats, nausea, vomiting, unrelenting pain, first onset of insidious LBP <20 y/o  PRECAUTIONS: None  WEIGHT BEARING RESTRICTIONS: No  FALLS: Has patient fallen in last 6 months? 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   Hobbies: Walking exercise, elliptical exercise routine  Patient Goals: Able to complete walking exercise pain-free; able to stand at public venues/publuc outings    OBJECTIVE:  GAIT: Distance walked: 40 ft Assistive device utilized: None Level of assistance: Complete Independence Comments: Symmetrical weightbearing, upright guarded posture   Posture: Lumbar lordosis: WNL Iliac crest height: Equal bilaterally Lumbar lateral shift: to L side   AROM AROM (Normal range in degrees) AROM   Lumbar   Flexion (65) 75% (pull in hamstrings)  Extension (30) 50% ("usually that hurts")  Right lateral flexion (25) 50%* (leg pain)  Left lateral flexion (25) 50%  Right rotation (30) 75%  Left rotation (30) WNL      Hip Right Left  Flexion (125)    Extension (15)    Abduction (40)    Adduction     Internal Rotation (45)    External Rotation (45)        (* = pain; Blank rows = not tested)  LE MMT: MMT (out of 5) Right  Left   Hip flexion    Hip extension    Hip abduction    Hip adduction    Hip internal rotation    Hip external rotation    Knee  flexion    Knee extension    Ankle dorsiflexion    Ankle plantarflexion    Ankle inversion    Ankle eversion    (* = pain; Blank rows = not tested)  Sensation Dec light touch R anterior thigh   Muscle Length Hamstrings: R: Positive L: Negative Ely (quadriceps): R: Not examined L: Not examined Thomas (hip flexors): R: Not examined L: Not examined   Palpation Location Right Left         Lumbar paraspinals 1 0  Quadratus Lumborum    Gluteus Maximus 1 0  Gluteus Medius    Deep hip external rotators    PSIS 1   Fortin's Area (SIJ) 0   Greater Trochanter 0   (Blank rows = not tested) Graded on 0-4 scale (0 = no pain, 1 = pain, 2 = pain with wincing/grimacing/flinching, 3 = pain with withdrawal, 4 = unwilling to allow palpation)   Passive Accessory Intervertebral Motion 11/17/22: Hypomobility with CPA L4-S1 and T7-T12. Pain prior to restriction at lower lumbar segments.  Special Tests Lumbar Radiculopathy and Discogenic: Slump (SN 83, -LR 0.32): R: Positive for L-sided back pain;  L: Positive for L-sided back pain SLR (SN 92, -LR 0.29): R: Negative L:  Negative  Facet Joint: Extension-Rotation (SN 100, -LR 0.0): R: Positive L: Positive  Lumbar Foraminal Stenosis: Lumbar quadrant (SN 70): R: Positive L: Positive   -No symptom reduction with hooklying general manual lumbar traction    TODAY'S TREATMENT: DATE: 11/24/2022    SUBJECTIVE STATEMENT:   Patient reports doing somewhat better since last visit. She reports having generally good weekend. Pt reports doing well upon waking and during walking up to 2 miles in intervals yesterday. Pt reports walking up to 10-15 mins. Pt reports symptoms are "not terrible" at arrival to PT. Pt reports lying prone didn't seem to quite give best results. Pt reports using Voltaren gel and she feels that this has helped. Pt reports completing knee to chest at home and she felt that this helped. Pt reports that repeated extension did seem  to effect some increased R leg symptoms.     Manual Therapy - for symptom modulation, soft tissue sensitivity and mobility, joint mobility, ROM   Bilateral long-leg distraction for nerve root decompression, with Mulligan belt, supine; 10 sec intermittent holds; x 8 minutes Unilateral R long-leg distraction for symptom modulation/nerve root decompression; x 5 minutes STM R>L lumbar erector spine L3-S1 and R gluteus maximus/medius; x 8 minutes   Trigger Point Dry Needling (TDN), unbilled Education performed with patient regarding potential benefit of TDN. Reviewed precautions and risks with patient. Reviewed special precautions/risks over lung fields which include pneumothorax. Reviewed signs and symptoms of pneumothorax and advised pt to go to ER immediately if these symptoms develop advise them of dry needling treatment. Extensive time spent with pt to ensure full understanding of TDN risks. Pt provided verbal consent to treatment. TDN performed to  R L4 and L5 multifidi, R gluteus maximus with 0.25 x 40 single needle placements with local twitch response (LTR). Pistoning technique utilized. Mild post-treatment soreness, though minimal pain reported in lying; return of symptoms with initial steps after getting off of treatment table.     Therapeutic Exercise - for improved soft tissue flexibility and extensibility as needed for ROM, improved strength as needed to improve performance of CKC activities/functional movements  Repeated flexion in lying; 2 x 10   Supine piriformis stretch; 2 x 30 sec on each side  -for HEP update   PATIENT EDUCATION: HEP update and review. Discussed strategies for positional relief of symptoms and use of pillows for prone lying to reduce lumbar lordosis with prone lying.    *not today* Supine sciatic nerve flossing; 2x10 on each side Repeated extension in lying; 2x10    PATIENT EDUCATION:  Education details: see above for patient education details Person  educated: Patient Education method: Explanation, Demonstration, and Handouts Education comprehension: verbalized understanding and returned demonstration   HOME EXERCISE PROGRAM:  Access Code: [email or web address] URL: [email or web address] Date: 11/24/2022 Prepared by: Consuela Mimes  Exercises - Supine Double Knee to Chest  - 5-6 x daily - 7 x weekly - 1 sets - 10 reps - 1sec hold - Hooklying Lumbar Traction  - 7 x weekly - 10 reps - 5-10sec hold - Supine 90/90 Sciatic Nerve Glide with Knee Flexion/Extension  - 2 x daily - 7 x weekly - 2 sets - 10 reps - 1-2sec hold - Supine Piriformis Stretch with Foot on Ground  - 2 x daily - 7 x weekly -  3 sets - 30sec hold   ASSESSMENT:  CLINICAL IMPRESSION: Patient has done well with prone lying position, but she reports aggravation of symptoms and increased R thigh pain with repeated extension. She attempted knee to chest/repeated flexion at home and felt that this worked better. We completed double knee to chest/repeated flexion in lying for 2 trials of 10 repetitions today with no significant symptoms in lower limbs afterward. We completed trial of dry needling for remaining flank and axial pain. Pt unfortunately feels rapid return of R lower quarter pain with initiation of walking. We will continue force progression of repeated flexion as needed and consider use of dry needling with e-stim. Pt presents with current impairments in: thoracolumbar AROM, posterior chain soft tissue extensibility/dural mobility, R-sided back pain with R lower limb referral pattern, and mild tautness/tenderness of R lumbar paraspinals/gluteal mm. Pt will continue to benefit from skilled PT services to address deficits and improve function.   OBJECTIVE IMPAIRMENTS: decreased mobility, difficulty walking, decreased ROM, decreased strength, impaired flexibility, and pain.   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   REHAB POTENTIAL: Good  CLINICAL DECISION MAKING: Evolving/moderate complexity  EVALUATION COMPLEXITY: Moderate   GOALS: Goals reviewed with patient? Yes  SHORT TERM GOALS: Target date: 12/06/2022  Pt will be independent with HEP in order to improve strength and decrease back pain to improve pain-free function at home and work. Baseline: 11/15/22: Baseline HEP initiated.  Goal status: INITIAL   LONG TERM GOALS: Target date: 12/30/2022  Pt will increase FOTO to at least 61 to demonstrate significant improvement in function at home and work related to back pain  Baseline: 11/15/22: 46 Goal status: INITIAL  2.  By 4 weeks, will decrease worst back pain by at least 2 points on the NPRS in order to demonstrate clinically significant reduction in back pain. Baseline: 11/15/22: 10/10 pain at worst Goal status: INITIAL  3.  Pt will complete standing/ambulatory activity up to half of her work day without exacerbation of back pain and NPRS < 2/10 as needed for community-level mobility and completion of work duties Baseline: 11/15/22: Difficulty standing more than a couple of minutes Goal status: INITIAL  4.  Patient will have full thoracolumbar AROM without reproduction of pain as needed for reaching items on ground, household chores, bending. Baseline: 11/15/22: Motion loss into flexion, extension, R/L lateral flexion, R rotation.  Goal status: INITIAL  5.  By completion of POC, will decrease worst back pain to no more than 2-3/10 on the NPRS in order to demonstrate clinically significant reduction in back pain and improved tolerance of work duties and community-level  functional mobility. Baseline: 11/15/22: 10/10 pain at worst Goal status: INITIAL   PLAN: PT FREQUENCY: 1-2x/week  PT DURATION: 6 weeks  PLANNED INTERVENTIONS: Therapeutic exercises, Therapeutic activity, Neuromuscular re-education, Balance training, Gait training, Patient/Family education, Self Care, Joint mobilization, Joint manipulation, Vestibular training, Canalith repositioning, Orthotic/Fit training, DME instructions, Dry Needling, Electrical stimulation, Spinal manipulation, Spinal mobilization, Cryotherapy, Moist heat, Taping, Traction, Ultrasound, Ionotophoresis 4mg /ml Dexamethasone, Manual therapy, and Re-evaluation.  PLAN FOR NEXT SESSION: Complete myotome screen/MMT.  Manual techniques for symptom modulation prn, bilateral/unilateral distraction. Progress with flexion-bias exercise; follow-up on response to updated HEP.  Consuela Mimes, PT, DPT #[id number]  Beth Krause, PT 11/24/2022, 12:22 PM

## 2022-11-24 ENCOUNTER — Encounter: Payer: Self-pay | Admitting: Physical Therapy

## 2022-11-24 ENCOUNTER — Ambulatory Visit: Payer: BC Managed Care – PPO | Admitting: Physical Therapy

## 2022-11-30 ENCOUNTER — Ambulatory Visit: Payer: BC Managed Care – PPO | Admitting: Physical Therapy

## 2022-11-30 DIAGNOSIS — M5416 Radiculopathy, lumbar region: Secondary | ICD-10-CM

## 2022-11-30 DIAGNOSIS — R262 Difficulty in walking, not elsewhere classified: Secondary | ICD-10-CM

## 2022-11-30 DIAGNOSIS — M5459 Other low back pain: Secondary | ICD-10-CM

## 2022-11-30 NOTE — Therapy (Signed)
OUTPATIENT PHYSICAL THERAPY TREATMENT   Patient Name: Beth Krause MRN: [id number] DOB:12/22/1960, 62 y.o., female Today's Date: 11/30/2022   END OF SESSION:  PT End of Session - 11/30/22 1517     Visit Number 4    Number of Visits 13    Date for PT Re-Evaluation 12/30/22    Authorization Type [employer], VL 60 combined PT/OT, plan year 05/03/22-05/02/23    PT Start Time 1516    PT Stop Time 1559    PT Time Calculation (min) 43 min    Behavior During Therapy [facility] for tasks assessed/performed                 No past medical history on file. No past surgical history on file. 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 Mariah Milling 11/25/22  PERTINENT HISTORY: Pt is a 62 year old female referred for back and radicular pain secondary to disc herniation and stenosis. She is status post right L3 L2-3 and left L3-4 TFESI 10/11/2022 with temporary improvement. Pain is worse with standing and walking and better with sitting. Hx of PT in May to June without much improvement. Pt works as PT - has worked for 35 years.   Pt reports symptoms started  in beginning of May. Pt reports back pain episode in 1994 in with MRI showing slight disc bulge. Pt reports walking up to 4-5 mi per day usually and completing elliptical/spin bike. She reports insidious atraumatic onset of pain in back with pain into R buttock region and R leg. Pt started with [facility]. Pt had 2 sets of ESI at L3-4 and L4-5 without resolution of symptoms. 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 Patient has to perform MaxA transfers at work. She has difficulty with long-distance walking. Pt reports constant feeling of stiffness. Pt reports no disturbed sleep due to her pain. No unexplained weight change. No benefit from Tramadol, muscle relaxers. Pt denies changes in bathroom habits.Pt reports continuous tingling along L3 distribution.  Pt reports that lumbar roll does not work usually. Pt reports feeling good in prone/prone on elbows.   PAIN:    Pain Intensity: Present: 0/10, Best: 0/10, Worst: 10/10 Pain location: R buttock and down to R thigh/distomedial R thigh; intermittent referral to L side of back Pain Quality: Sharp pain in back, tingling into thigh region  Radiating: Yes , radiating to buttock and R thigh Numbness/Tingling: Yes; R anterior thigh  Focal Weakness: No Aggravating factors: prolonged walking, getting out of bed first thing, prolonged standing/static standing  Relieving factors: on the move, prone/prone on elbows 24-hour pain behavior: worse in AM, bad in evening; better with movement in middle of day How long  can you stand: No more than a couple of minutes History of prior back injury, pain, surgery, or therapy: Yes; one injury in 90s that resolved without treatment   Imaging: Yes , MRI 08/26/22  IMPRESSION: 1.  Moderate spinal stenosis at L3-L4 with bilateral foraminal disc protrusions causing severe right neural foraminal stenosis. Modic type I endplate changes at this level. 2.  6 x 8 mm synovial cyst on the left at L5-S1  abutting the traversing S1 nerve root. 3.  Right facet capsular distention from L2-L3 to L4-L5. Increase T2 signal in the right L3 and L4 pedicles may be reactive to acute stress related changes.    Red flags: Negative for bowel/bladder changes, saddle paresthesia, personal history of cancer, h/o spinal tumors, h/o compression fx, h/o abdominal aneurysm, abdominal pain, chills/fever, night sweats, nausea, vomiting, unrelenting pain, first onset of insidious LBP <20 y/o  PRECAUTIONS: None  WEIGHT BEARING RESTRICTIONS: No  FALLS: Has patient fallen in last 6 months? 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   Hobbies: Walking exercise, elliptical exercise routine  Patient Goals: Able to complete walking exercise pain-free; able to stand at public venues/publuc outings    OBJECTIVE:  GAIT: Distance walked: 40 ft Assistive device utilized: None Level of assistance: Complete Independence Comments: Symmetrical weightbearing, upright guarded posture   Posture: Lumbar lordosis: WNL Iliac crest height: Equal bilaterally Lumbar lateral shift: to L side   AROM AROM (Normal range in degrees) AROM   Lumbar   Flexion (65) 75% (pull in hamstrings)  Extension (30) 50% ("usually that hurts")  Right lateral flexion (25) 50%* (leg pain)  Left lateral flexion (25) 50%  Right rotation (30) 75%  Left rotation (30) WNL      Hip Right Left  Flexion (125)    Extension (15)    Abduction (40)    Adduction     Internal Rotation (45)    External Rotation (45)        (* = pain; Blank rows = not tested)  LE MMT: MMT (out of 5) Right  Left   Hip flexion    Hip extension    Hip abduction    Hip adduction    Hip internal rotation    Hip external rotation    Knee flexion    Knee extension     Ankle dorsiflexion    Ankle plantarflexion    Ankle inversion    Ankle eversion    (* = pain; Blank rows = not tested)  Sensation Dec light touch R anterior thigh   Muscle Length Hamstrings: R: Positive L: Negative Ely (quadriceps): R: Not examined L: Not examined Thomas (hip flexors): R: Not examined L: Not examined   Palpation Location Right Left         Lumbar paraspinals 1 0  Quadratus Lumborum    Gluteus Maximus 1 0  Gluteus Medius    Deep hip external rotators    PSIS 1   Fortin's Area (SIJ) 0   Greater Trochanter 0   (Blank rows = not tested) Graded on 0-4 scale (0 = no pain, 1 = pain, 2 = pain with wincing/grimacing/flinching, 3 = pain with withdrawal, 4 = unwilling to allow palpation)   Passive Accessory Intervertebral Motion 11/17/22: Hypomobility with CPA L4-S1 and T7-T12. Pain prior to restriction at lower lumbar segments.  Special Tests Lumbar Radiculopathy and Discogenic: Slump (SN 83, -LR 0.32): R: Positive for L-sided back pain;  L: Positive for L-sided back pain SLR (SN 92, -LR 0.29): R: Negative L:  Negative  Facet Joint: Extension-Rotation (SN 100, -LR 0.0): R: Positive L: Positive  Lumbar Foraminal Stenosis: Lumbar quadrant (SN 70): R: Positive L: Positive   -No symptom reduction with hooklying general manual lumbar traction    TODAY'S TREATMENT: DATE: 11/30/2022    SUBJECTIVE STATEMENT:   Patient reports severe limitation with standing/walking at this time. She has completed repeated flexion. She had to stop walking after 10 minutes of walking. 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 Patient reports symptoms are "okay" at the moment - she reports doing well with sitting and car rides. Static standing activities are most challenging - e.g. standing in shower and standing in kitchen. Pt reports more paresthesias in R lower limb with lower dose of Neurontin. 8-9/10 pain when it is bad, 3-4/10 pain  with not moving. Pt reports notable muscle soreness for about 2 days after dry needling.     Manual Therapy - for symptom modulation, soft tissue sensitivity and mobility, joint mobility, ROM   Bilateral LE traction with pt in hooklying for nerve root decompression, with Mulligan belt, supine; 10 sec intermittent holds; x 8 minutes Unilateral R long-leg distraction for symptom modulation/nerve root decompression; x 5 minutes STM R>L lumbar erector spine L3-S1 and R gluteus maximus/medius; x 8 minutes   Trigger Point Dry Needling (TDN), unbilled Education performed with patient regarding potential benefit of TDN. Reviewed precautions and risks with patient. Reviewed special precautions/risks over lung fields which include pneumothorax. Reviewed signs and symptoms of pneumothorax and advised pt to go to ER immediately if these symptoms develop advise them of dry needling treatment. Extensive time spent with pt to ensure full understanding of TDN risks. Pt provided verbal consent to treatment. TDN performed to  R L4 and L5 multifidi, R gluteus maximus with 0.25 x 40 single needle placements with local twitch response (LTR). Pistoning technique utilized. Mild post-treatment soreness, though minimal pain reported in lying; return of symptoms with initial steps after getting off of treatment table.     Therapeutic Exercise - for improved soft tissue flexibility and extensibility as needed for ROM, improved strength as needed to improve performance of CKC activities/functional movements  Repeated flexion in lying, with clinician overpressure; 2 x 10   Lower trunk rotation; x10 alternating R/L  Supine sciatic nerve flossing; 2x10 on each side     PATIENT EDUCATION: HEP update and review. Discussed strategies for positional relief of symptoms and use of pillows for prone lying to reduce lumbar lordosis with prone lying.    *not today* Supine piriformis stretch; 2 x 30 sec on each side  -for  HEP update Repeated extension in lying; 2x10    PATIENT EDUCATION:  Education details: see above for patient education details Person educated: Patient Education method: Explanation, Demonstration, and Handouts Education comprehension: verbalized understanding and returned demonstration   HOME EXERCISE PROGRAM:  Access Code: [email or web address] URL: [email or web address] Date: 11/24/2022 Prepared by: Consuela Mimes  Exercises - Supine Double Knee to Chest  - 5-6 x daily - 7 x weekly - 1 sets - 10 reps - 1sec hold - Hooklying Lumbar Traction  - 7 x weekly - 10 reps - 5-10sec hold - Supine 90/90 Sciatic Nerve Glide with Knee Flexion/Extension  - 2 x daily - 7 x weekly -  2 sets - 10 reps - 1-2sec hold - Supine Piriformis Stretch with Foot on Ground  - 2 x daily - 7 x weekly - 3 sets - 30sec hold   ASSESSMENT:  CLINICAL IMPRESSION: Patient has done well with prone lying position, but she reports aggravation of symptoms and increased R thigh pain with repeated extension. She attempted knee to chest/repeated flexion at home and felt that this worked better. We completed double knee to chest/repeated flexion in lying for 2 trials of 10 repetitions today with no significant symptoms in lower limbs afterward. We completed trial of dry needling for remaining flank and axial pain. Pt unfortunately feels rapid return of R lower quarter pain with initiation of walking. We will continue force progression of repeated flexion as needed and consider use of dry needling with e-stim. Pt presents with current impairments in: thoracolumbar AROM, posterior chain soft tissue extensibility/dural mobility, R-sided back pain with R lower limb referral pattern, and mild tautness/tenderness of R lumbar paraspinals/gluteal mm. Pt will continue to benefit from skilled PT services to address deficits and improve function.   OBJECTIVE IMPAIRMENTS: decreased mobility, difficulty walking, decreased ROM, decreased  strength, impaired flexibility, and pain.   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   REHAB POTENTIAL: Good  CLINICAL DECISION MAKING: Evolving/moderate complexity  EVALUATION COMPLEXITY: Moderate   GOALS: Goals reviewed with patient? Yes  SHORT TERM GOALS: Target date: 12/06/2022  Pt will be independent with HEP in order to improve strength and decrease back pain to improve pain-free function at home and work. Baseline: 11/15/22: Baseline HEP initiated.  Goal status: INITIAL   LONG TERM GOALS: Target date: 12/30/2022  Pt will increase FOTO to at least 61 to demonstrate significant improvement in function at home and work related to back pain  Baseline: 11/15/22: 46 Goal status: INITIAL  2.  By 4 weeks, will decrease worst back pain by at least 2 points on the NPRS in order to demonstrate clinically significant reduction in back pain. Baseline: 11/15/22: 10/10 pain at worst Goal status: INITIAL  3.  Pt will complete standing/ambulatory activity up to half of her work day without exacerbation of back pain and NPRS < 2/10 as needed for community-level mobility and completion of work duties Baseline: 11/15/22: Difficulty standing more than a couple of minutes Goal status: INITIAL  4.  Patient will have full thoracolumbar AROM without reproduction of pain as needed for reaching items on ground, household chores, bending. Baseline: 11/15/22: Motion loss into flexion, extension, R/L lateral flexion, R rotation.  Goal status: INITIAL  5.  By completion of POC, will decrease worst back pain to no more than 2-3/10 on the  NPRS in order to demonstrate clinically significant reduction in back pain and improved tolerance of work duties and community-level functional mobility. Baseline: 11/15/22: 10/10 pain at worst Goal status: INITIAL   PLAN: PT FREQUENCY: 1-2x/week  PT DURATION: 6 weeks  PLANNED INTERVENTIONS: Therapeutic exercises, Therapeutic activity, Neuromuscular re-education, Balance training, Gait training, Patient/Family education, Self Care, Joint mobilization, Joint manipulation, Vestibular training, Canalith repositioning, Orthotic/Fit training, DME instructions, Dry Needling, Electrical stimulation, Spinal manipulation, Spinal mobilization, Cryotherapy, Moist heat, Taping, Traction, Ultrasound, Ionotophoresis 4mg /ml Dexamethasone, Manual therapy, and Re-evaluation.  PLAN FOR NEXT SESSION:  Complete myotome screen/MMT.  Manual techniques for symptom modulation prn, bilateral/unilateral distraction. Progress with flexion-bias exercise; follow-up on response to updated HEP.    THIS NOTE IS INCOMPLETE, PLEASE DO NOT REFERENCE FOR INFORMATION  Consuela Mimes, PT, DPT #[id number]  Gertie Exon, PT 11/30/2022, 3:18 PM

## 2022-12-03 ENCOUNTER — Encounter: Payer: Self-pay | Admitting: Physical Therapy

## 2022-12-07 ENCOUNTER — Ambulatory Visit: Payer: BC Managed Care – PPO | Attending: Physical Medicine & Rehabilitation | Admitting: Physical Therapy

## 2022-12-07 DIAGNOSIS — M5416 Radiculopathy, lumbar region: Secondary | ICD-10-CM | POA: Diagnosis present

## 2022-12-07 DIAGNOSIS — R262 Difficulty in walking, not elsewhere classified: Secondary | ICD-10-CM

## 2022-12-07 DIAGNOSIS — M5459 Other low back pain: Secondary | ICD-10-CM | POA: Diagnosis present

## 2022-12-07 NOTE — Therapy (Unsigned)
OUTPATIENT PHYSICAL THERAPY TREATMENT   Patient Name: Beth Krause MRN: [id number] DOB:[birth year], 62 y.o., female Today's Date: 12/07/2022   END OF SESSION:  PT End of Session - 12/07/22 1515     Visit Number 5    Number of Visits 13    Date for PT Re-Evaluation 12/30/22    Authorization Type [employer], VL 60 combined PT/OT, plan year 05/03/22-05/02/23    PT Start Time 1516    PT Stop Time 1600    PT Time Calculation (min) 44 min    Behavior During Therapy [facility] for tasks assessed/performed             No past medical history on file. No past surgical history on file. 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 Mariah Milling 11/25/22  PERTINENT HISTORY: Pt is a 62 year old female referred for back and radicular pain secondary to disc herniation and stenosis. She is status post right L3 L2-3 and left L3-4 TFESI 10/11/2022 with temporary improvement. Pain is worse with standing and walking and better with sitting. Hx of PT in May to June without much improvement. Pt works as PT - has worked for 35 years.   Pt reports symptoms started in  beginning of May. Pt reports back pain episode in 1994 in with MRI showing slight disc bulge. Pt reports walking up to 4-5 mi per day usually and completing elliptical/spin bike. She reports insidious atraumatic onset of pain in back with pain into R buttock region and R leg. Pt started with [facility]. Pt had 2 sets of ESI at L3-4 and L4-5 without resolution of symptoms. 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 Patient has to perform MaxA transfers at work. She has difficulty with long-distance walking. Pt reports constant feeling of stiffness. Pt reports no disturbed sleep due to her pain. No unexplained weight change. No benefit from Tramadol, muscle relaxers. Pt denies changes in bathroom habits.Pt reports continuous tingling along L3 distribution.  Pt reports that lumbar roll does not work usually. Pt reports feeling good in prone/prone on elbows.   PAIN:    Pain Intensity: Present: 0/10, Best: 0/10, Worst: 10/10 Pain location: R buttock and down to R thigh/distomedial R thigh; intermittent referral to L side of back Pain Quality: Sharp pain in back, tingling into thigh region  Radiating: Yes , radiating to buttock and R thigh Numbness/Tingling: Yes; R anterior thigh  Focal Weakness: No Aggravating factors: prolonged walking, getting out of bed first thing, prolonged standing/static standing  Relieving factors: on the move, prone/prone on elbows 24-hour pain behavior: worse in AM, bad in evening; better with movement in middle of day How long can you stand: No  more than a couple of minutes History of prior back injury, pain, surgery, or therapy: Yes; one injury in 90s that resolved without treatment   Imaging: Yes , MRI 08/26/22  IMPRESSION: 1.  Moderate spinal stenosis at L3-L4 with bilateral foraminal disc protrusions causing severe right neural foraminal stenosis. Modic type I endplate changes at this level. 2.  6 x 8 mm synovial cyst on the left at L5-S1 abutting  the traversing S1 nerve root. 3.  Right facet capsular distention from L2-L3 to L4-L5. Increase T2 signal in the right L3 and L4 pedicles may be reactive to acute stress related changes.    Red flags: Negative for bowel/bladder changes, saddle paresthesia, personal history of cancer, h/o spinal tumors, h/o compression fx, h/o abdominal aneurysm, abdominal pain, chills/fever, night sweats, nausea, vomiting, unrelenting pain, first onset of insidious LBP <20 y/o  PRECAUTIONS: None  WEIGHT BEARING RESTRICTIONS: No  FALLS: Has patient fallen in last 6 months? 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   Hobbies: Walking exercise, elliptical exercise routine  Patient Goals: Able to complete walking exercise pain-free; able to stand at public venues/publuc outings    OBJECTIVE:  GAIT: Distance walked: 40 ft Assistive device utilized: None Level of assistance: Complete Independence Comments: Symmetrical weightbearing, upright guarded posture   Posture: Lumbar lordosis: WNL Iliac crest height: Equal bilaterally Lumbar lateral shift: to L side   AROM AROM (Normal range in degrees) AROM   Lumbar   Flexion (65) 75% (pull in hamstrings)  Extension (30) 50% ("usually that hurts")  Right lateral flexion (25) 50%* (leg pain)  Left lateral flexion (25) 50%  Right rotation (30) 75%  Left rotation (30) WNL      Hip Right Left  Flexion (125)    Extension (15)    Abduction (40)    Adduction     Internal Rotation (45)    External Rotation (45)        (* = pain; Blank rows = not tested)  LE MMT: MMT (out of 5) Right  Left   Hip flexion    Hip extension    Hip abduction    Hip adduction    Hip internal rotation    Hip external rotation    Knee flexion    Knee extension    Ankle  dorsiflexion    Ankle plantarflexion    Ankle inversion    Ankle eversion    (* = pain; Blank rows = not tested)  Sensation Dec light touch R anterior thigh   Muscle Length Hamstrings: R: Positive L: Negative Ely (quadriceps): R: Not examined L: Not examined Thomas (hip flexors): R: Not examined L: Not examined   Palpation Location Right Left         Lumbar paraspinals 1 0  Quadratus Lumborum    Gluteus Maximus 1 0  Gluteus Medius    Deep hip external rotators    PSIS 1   Fortin's Area (SIJ) 0   Greater Trochanter 0   (Blank rows = not tested) Graded on 0-4 scale (0 = no pain, 1 = pain, 2 = pain with wincing/grimacing/flinching, 3 = pain with withdrawal, 4 = unwilling to allow palpation)   Passive Accessory Intervertebral Motion 11/17/22: Hypomobility with CPA L4-S1 and T7-T12. Pain prior to restriction at lower lumbar segments.    Special Tests  Lumbar Radiculopathy and Discogenic: Slump (SN 83, -LR 0.32): R: Positive for L-sided back pain;  L: Positive for L-sided back pain SLR (SN 92, -LR 0.29): R: Negative L:  Negative  Facet Joint: Extension-Rotation (SN 100, -LR 0.0): R: Positive L: Positive  Lumbar Foraminal Stenosis: Lumbar quadrant (SN 70): R: Positive L: Positive   -No symptom reduction with hooklying general manual lumbar traction    TODAY'S TREATMENT: DATE: 12/07/2022    SUBJECTIVE STATEMENT:   Patient reports feeling somewhat better after last visit. Patient is unsure if it was needling or something else. Patient reports more pain affecting L side at arrival to PT; it can fluctuate between R/L. Pt is concerned with clunking with moving lumbar spine in sitting. Patient reports mild pain, 3/10 during sitting. Pt reports pain up to 8/10 with working today.     Manual Therapy - for symptom modulation, soft tissue sensitivity and mobility, joint mobility, ROM   Bilateral LE traction with pt in hooklying for nerve root decompression, with Mulligan  belt, supine; 10 sec intermittent holds; x 10 minutes STM R>L lumbar erector spine L3-S1 and R gluteus maximus/medius; x 15 minutes   *not today* Unilateral R long-leg distraction for symptom modulation/nerve root decompression; x 5 minutes    Trigger Point Dry Needling (TDN) with electrical stimulation, dry needling unbilled Education performed with patient regarding potential benefits and risks of TDN at previous visit. Pt provided verbal consent to treatment. TDN performed to bilateral L4 and bilateral S1 multifidi (0.30 x 70 needle placements) with needles kept in for electric stimulation utilizing E-Stim II unit. 7.5 minutes at 4.5 intensity with 5 pps frequency; 7.5 minutes at 80 pps with microcurrent. Minimal soreness or reported symptoms following intervention; re-occurring LE referred symptoms with stepping/gait.     Therapeutic Exercise - for improved soft tissue flexibility and extensibility as needed for ROM, improved strength as needed to improve performance of CKC activities/functional movements  Repeated flexion in lying, with clinician overpressure; 2 x 10    Ambulate laps around gym x 3 to re-test tolerance of stepping/gait  -return of RLE paresthesias following 3 laps   AROM screen Lumbar flexion  WFL Lumbar extension 100% (tingling still there) Lateral flexion: R 50%*, L 100% Thoracolumbar rotation: R 100%, L 100%   Lower trunk rotation; x10 alternating R/L  Supine sciatic nerve flossing; reviewed for technique    PATIENT EDUCATION: Discussed continuing with flexion-bias program with work on trunk and hip strengthening and activity modification prn.    *not today* Supine piriformis stretch; 2 x 30 sec on each side  -for HEP update Repeated extension in lying; 2x10    PATIENT EDUCATION:  Education details: see above for patient education details Person educated: Patient Education method: Explanation, Demonstration, and Handouts Education  comprehension: verbalized understanding and returned demonstration   HOME EXERCISE PROGRAM:  Access Code: [email or web address] URL: [email or web address] Date: 11/24/2022 Prepared by: Consuela Mimes  Exercises - Supine Double Knee to Chest  - 5-6 x daily - 7 x weekly - 1 sets - 10 reps - 1sec hold - Hooklying Lumbar Traction  - 7 x weekly - 10 reps - 5-10sec hold - Supine 90/90 Sciatic Nerve Glide with Knee Flexion/Extension  - 2 x daily - 7 x weekly - 2 sets - 10 reps - 1-2sec hold - Supine Piriformis Stretch with Foot on Ground  - 2 x daily - 7 x weekly - 3 sets - 30sec hold   ASSESSMENT:  CLINICAL IMPRESSION: Patient did report benefit  with dry needling last visit. We utilized this again today with addition of e-stim along affected region of lumbar spine to treat via TrP model and radiculopathic model. Patient is also apparent flexion responder and will benefit from continued flexion-based exercise and use of repeated knee to chest as primary movement; we discussed emphasis on end-range with each repetition today.  Pt presents with current impairments in: thoracolumbar AROM, posterior chain soft tissue extensibility/dural mobility, R-sided back pain with R lower limb referral pattern, and mild tautness/tenderness of R lumbar paraspinals/gluteal mm. Pt will continue to benefit from skilled PT services to address deficits and improve function.   OBJECTIVE IMPAIRMENTS: decreased mobility, difficulty walking, decreased ROM, decreased strength, impaired flexibility, and pain.   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    REHAB POTENTIAL: Good  CLINICAL DECISION MAKING: Evolving/moderate complexity  EVALUATION COMPLEXITY: Moderate   GOALS: Goals reviewed with patient? Yes  SHORT TERM GOALS: Target date: 12/06/2022  Pt will be independent with HEP in order to improve strength and decrease back pain to improve pain-free function at home and work. Baseline: 11/15/22: Baseline HEP initiated.  Goal status: INITIAL   LONG TERM GOALS: Target date: 12/30/2022  Pt will increase FOTO to at least 61 to demonstrate significant improvement in function at home and work related to back pain  Baseline: 11/15/22: 46 Goal status: INITIAL  2.  By 4 weeks, will decrease worst back pain by at least 2 points on the NPRS in order to demonstrate clinically significant reduction in back pain. Baseline: 11/15/22: 10/10 pain at worst Goal status: INITIAL  3.  Pt will complete standing/ambulatory activity up to half of her work day without exacerbation of back pain and NPRS < 2/10 as needed for community-level mobility and completion of work duties Baseline: 11/15/22: Difficulty standing more than a couple of minutes Goal status: INITIAL  4.  Patient will have full thoracolumbar AROM without reproduction of pain as needed for reaching items on ground, household chores, bending. Baseline: 11/15/22: Motion loss into flexion, extension, R/L lateral flexion, R rotation.  Goal status: INITIAL  5.  By completion of POC, will decrease worst back pain to no more than 2-3/10 on the NPRS in order to demonstrate clinically significant reduction in back pain and improved tolerance of work duties and community-level functional mobility. Baseline: 11/15/22: 10/10 pain at worst Goal status: INITIAL   PLAN: PT FREQUENCY: 1-2x/week  PT DURATION: 6 weeks  PLANNED INTERVENTIONS: Therapeutic exercises, Therapeutic activity, Neuromuscular re-education, Balance training, Gait training, Patient/Family education, Self Care, Joint  mobilization, Joint manipulation, Vestibular training, Canalith repositioning, Orthotic/Fit training, DME instructions, Dry Needling, Electrical stimulation, Spinal manipulation, Spinal mobilization, Cryotherapy, Moist heat, Taping, Traction, Ultrasound, Ionotophoresis 4mg /ml Dexamethasone, Manual therapy, and Re-evaluation.  PLAN FOR NEXT SESSION: Complete myotome screen/MMT next visit.  Manual techniques for symptom modulation prn, bilateral/unilateral distraction. Progress with flexion-bias exercise; follow-up on response to updated HEP.     Consuela Mimes, PT, DPT #[id number]  Gertie Exon, PT 12/07/2022, 3:15 PM

## 2022-12-09 ENCOUNTER — Ambulatory Visit: Payer: BC Managed Care – PPO | Admitting: Physical Therapy

## 2022-12-09 ENCOUNTER — Encounter: Payer: Self-pay | Admitting: Physical Therapy

## 2022-12-09 DIAGNOSIS — M5416 Radiculopathy, lumbar region: Secondary | ICD-10-CM | POA: Diagnosis not present

## 2022-12-09 DIAGNOSIS — R262 Difficulty in walking, not elsewhere classified: Secondary | ICD-10-CM

## 2022-12-09 DIAGNOSIS — M5459 Other low back pain: Secondary | ICD-10-CM

## 2022-12-09 NOTE — Therapy (Signed)
OUTPATIENT PHYSICAL THERAPY TREATMENT   Patient Name: Beth Krause MRN: [id number] DOB:21-Jan-1961, 62 y.o., female Today's Date: 12/09/2022   END OF SESSION:  PT End of Session - 12/09/22 1601     Visit Number 6    Number of Visits 13    Date for PT Re-Evaluation 12/30/22    Authorization Type [employer], VL 60 combined PT/OT, plan year 05/03/22-05/02/23    PT Start Time 1601    PT Stop Time 1643    PT Time Calculation (min) 42 min    Behavior During Therapy [facility] for tasks assessed/performed              No past medical history on file. No past surgical history on file. 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 Mariah Milling 11/25/22  PERTINENT HISTORY: Pt is a 62 year old female referred for back and radicular pain secondary to disc herniation and stenosis. She is status post right L3 L2-3 and left L3-4 TFESI 10/11/2022 with temporary improvement. Pain is worse with standing and walking and better with sitting. Hx of PT in May to June without much improvement. Pt works as PT - has worked for 35 years.   Pt reports symptoms started in beginning  of May. Pt reports back pain episode in 1994 in with MRI showing slight disc bulge. Pt reports walking up to 4-5 mi per day usually and completing elliptical/spin bike. She reports insidious atraumatic onset of pain in back with pain into R buttock region and R leg. Pt started with [facility]. Pt had 2 sets of ESI at L3-4 and L4-5 without resolution of symptoms. 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 Patient has to perform MaxA transfers at work. She has difficulty with long-distance walking. Pt reports constant feeling of stiffness. Pt reports no disturbed sleep due to her pain. No unexplained weight change. No benefit from Tramadol, muscle relaxers. Pt denies changes in bathroom habits.Pt reports continuous tingling along L3 distribution.  Pt reports that lumbar roll does not work usually. Pt reports feeling good in prone/prone on elbows.   PAIN:    Pain Intensity: Present: 0/10, Best: 0/10, Worst: 10/10 Pain location: R buttock and down to R thigh/distomedial R thigh; intermittent referral to L side of back Pain Quality: Sharp pain in back, tingling into thigh region  Radiating: Yes , radiating to buttock and R thigh Numbness/Tingling: Yes; R anterior thigh  Focal Weakness: No Aggravating factors: prolonged walking, getting out of bed first thing, prolonged standing/static standing  Relieving factors: on the move, prone/prone on elbows 24-hour pain behavior: worse in AM, bad in evening; better with movement in middle of day How long can you stand: No  more than a couple of minutes History of prior back injury, pain, surgery, or therapy: Yes; one injury in 90s that resolved without treatment   Imaging: Yes , MRI 08/26/22  IMPRESSION: 1.  Moderate spinal stenosis at L3-L4 with bilateral foraminal disc protrusions causing severe right neural foraminal stenosis. Modic type I endplate changes at this level. 2.  6 x 8 mm synovial cyst on the left at L5-S1 abutting the  traversing S1 nerve root. 3.  Right facet capsular distention from L2-L3 to L4-L5. Increase T2 signal in the right L3 and L4 pedicles may be reactive to acute stress related changes.    Red flags: Negative for bowel/bladder changes, saddle paresthesia, personal history of cancer, h/o spinal tumors, h/o compression fx, h/o abdominal aneurysm, abdominal pain, chills/fever, night sweats, nausea, vomiting, unrelenting pain, first onset of insidious LBP <20 y/o  PRECAUTIONS: None  WEIGHT BEARING RESTRICTIONS: No  FALLS: Has patient fallen in last 6 months? 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   Hobbies: Walking exercise, elliptical exercise routine  Patient Goals: Able to complete walking exercise pain-free; able to stand at public venues/publuc outings    OBJECTIVE:  GAIT: Distance walked: 40 ft Assistive device utilized: None Level of assistance: Complete Independence Comments: Symmetrical weightbearing, upright guarded posture   Posture: Lumbar lordosis: WNL Iliac crest height: Equal bilaterally Lumbar lateral shift: to L side   AROM AROM (Normal range in degrees) AROM   Lumbar   Flexion (65) 75% (pull in hamstrings)  Extension (30) 50% ("usually that hurts")  Right lateral flexion (25) 50%* (leg pain)  Left lateral flexion (25) 50%  Right rotation (30) 75%  Left rotation (30) WNL      Hip Right Left  Flexion (125)    Extension (15)    Abduction (40)    Adduction     Internal Rotation (45)    External Rotation (45)        (* = pain; Blank rows = not tested)  LE MMT: MMT (out of 5) Right  Left   Hip flexion    Hip extension    Hip abduction    Hip adduction    Hip internal rotation    Hip external rotation    Knee flexion    Knee extension    Ankle  dorsiflexion    Ankle plantarflexion    Ankle inversion    Ankle eversion    (* = pain; Blank rows = not tested)  Sensation Dec light touch R anterior thigh   Muscle Length Hamstrings: R: Positive L: Negative Ely (quadriceps): R: Not examined L: Not examined Thomas (hip flexors): R: Not examined L: Not examined   Palpation Location Right Left         Lumbar paraspinals 1 0  Quadratus Lumborum    Gluteus Maximus 1 0  Gluteus Medius    Deep hip external rotators    PSIS 1   Fortin's Area (SIJ) 0   Greater Trochanter 0   (Blank rows = not tested) Graded on 0-4 scale (0 = no pain, 1 = pain, 2 = pain with wincing/grimacing/flinching, 3 = pain with withdrawal, 4 = unwilling to allow palpation)   Passive Accessory Intervertebral Motion 11/17/22: Hypomobility with CPA L4-S1 and T7-T12. Pain prior to restriction at lower lumbar segments.    Special Tests  Lumbar Radiculopathy and Discogenic: Slump (SN 83, -LR 0.32): R: Positive for L-sided back pain;  L: Positive for L-sided back pain SLR (SN 92, -LR 0.29): R: Negative L:  Negative  Facet Joint: Extension-Rotation (SN 100, -LR 0.0): R: Positive L: Positive  Lumbar Foraminal Stenosis: Lumbar quadrant (SN 70): R: Positive L: Positive   -No symptom reduction with hooklying general manual lumbar traction    TODAY'S TREATMENT: DATE: 12/09/2022    SUBJECTIVE STATEMENT:   Patient reports having significant soreness Tuesday afternoon after DN with e-stim. Patient reports having bad day yesterday - unsure if this is associated with DN or just having bad day regardless of treatment. Patient reports notable stiffness affecting low back at arrival. Patient reports most pain affecting R SI region at arrival to PT - 4/10 pain presently. Patient reports continuous parethesias into R lower limb. Pt reports notable pain Tuesday evening; unsure of her appraisal of DN.    Manual Therapy - for symptom modulation, soft tissue sensitivity and  mobility, joint mobility, ROM   Bilateral LE traction with pt in hooklying for nerve root decompression, with Mulligan belt, supine; 10 sec intermittent holds; x 10 minutes STM R>L lumbar erector spine L3-S1 and R gluteus maximus/medius; x 10 minutes   *not today* Unilateral R long-leg distraction for symptom modulation/nerve root decompression; x 5 minutes    Trigger Point Dry Needling (TDN) with electrical stimulation, dry needling unbilled Education performed with patient regarding potential benefits and risks of TDN at previous visit. Pt provided verbal consent to treatment. TDN performed to bilateral L4 and bilateral S1 multifidi (0.30 x 70 needle placements) with needles kept in for electric stimulation utilizing E-Stim II unit. 7.5 minutes at 4.5 intensity with 5 pps frequency; 7.5 minutes at 80 pps with microcurrent. Minimal soreness or reported symptoms following intervention; re-occurring LE referred symptoms with stepping/gait.     Therapeutic Exercise - for improved soft tissue flexibility and extensibility as needed for ROM, improved strength as needed to improve performance of CKC activities/functional movements  Repeated flexion in lying, with clinician overpressure; 2 x 10   Ambulate laps around gym x 3 to re-test tolerance of stepping/gait  -return of RLE paresthesias following 3 laps   AROM screen Lumbar flexion  WFL Lumbar extension 100%* (R-sided back pain)  Lateral flexion: R 50%*, L 100% Thoracolumbar rotation: R 50%, L 75%   PATIENT EDUCATION: We reviewed current home exercises and dicussed continuing with flexion as primary repeated movement as use of flexion-bias exercises.     *not today* Lower trunk rotation; x10 alternating R/L Supine sciatic nerve flossing; reviewed for technique Supine piriformis stretch; 2 x 30 sec on each side  -for HEP update Repeated extension in lying; 2x10    PATIENT EDUCATION:  Education details: see above for patient  education details Person educated: Patient Education method: Explanation, Demonstration, and Handouts Education comprehension: verbalized understanding and returned demonstration   HOME EXERCISE PROGRAM:  Access Code: [email or web address] URL: [email or web address] Date: 11/24/2022 Prepared by: Consuela Mimes  Exercises - Supine Double Knee to Chest  - 5-6 x daily - 7 x weekly - 1 sets - 10 reps - 1sec hold - Hooklying Lumbar Traction  - 7 x weekly - 10 reps - 5-10sec hold - Supine 90/90 Sciatic Nerve Glide with Knee Flexion/Extension  - 2 x daily - 7 x weekly - 2 sets - 10 reps - 1-2sec hold - Supine Piriformis Stretch with Foot on Ground  - 2 x daily - 7 x weekly -  3 sets - 30sec hold   ASSESSMENT:  CLINICAL IMPRESSION: Pt reports minimal effect of last trial of dry needling, though she felt that previous trial did help notably. We completed secondary trial of dry needling with e-stim today with increased amperage and ensuring contact with lamina with each insertion to get best effect of DN treatment via radiculopathic model. 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 Pt participates very well with PT and has given good faith effort to date. We will f/u next visit on response to treatment today. Pt presents with current impairments in: thoracolumbar AROM, posterior chain soft tissue extensibility/dural mobility, R-sided back pain with R lower limb referral pattern, and mild tautness/tenderness of R lumbar paraspinals/gluteal mm. Pt will continue to benefit from skilled PT services to address deficits and improve function.   OBJECTIVE IMPAIRMENTS: decreased mobility, difficulty walking, decreased ROM, decreased strength, impaired flexibility, and pain.   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   REHAB POTENTIAL: Good  CLINICAL DECISION MAKING: Evolving/moderate complexity  EVALUATION COMPLEXITY: Moderate   GOALS: Goals reviewed with patient? Yes  SHORT TERM GOALS: Target date: 12/06/2022  Pt will be independent with HEP in order to improve strength and decrease back pain to improve pain-free function at home and work. Baseline: 11/15/22: Baseline HEP initiated.  Goal status: INITIAL   LONG TERM GOALS: Target date: 12/30/2022  Pt will increase FOTO to at least 61 to demonstrate significant improvement in function at home and work related to back pain  Baseline: 11/15/22: 46 Goal status: INITIAL  2.  By 4 weeks, will decrease worst back pain by at least 2 points on the NPRS in order to demonstrate clinically significant reduction in back pain. Baseline: 11/15/22: 10/10 pain at worst Goal status: INITIAL  3.  Pt will complete standing/ambulatory activity up to half of her work day without exacerbation of back pain and NPRS < 2/10 as needed for community-level mobility and completion of work duties Baseline: 11/15/22: Difficulty standing more than a couple of minutes Goal status: INITIAL  4.  Patient will have full thoracolumbar AROM without reproduction of pain as needed for reaching items on ground, household chores, bending. Baseline: 11/15/22: Motion loss into flexion, extension, R/L lateral flexion, R rotation.  Goal status: INITIAL  5.  By completion of POC, will decrease worst back pain to no more than 2-3/10 on the NPRS in order to demonstrate clinically significant reduction in back pain and  improved tolerance of work duties and community-level functional mobility. Baseline: 11/15/22: 10/10 pain at worst Goal status: INITIAL   PLAN: PT FREQUENCY: 1-2x/week  PT DURATION: 6 weeks  PLANNED INTERVENTIONS: Therapeutic exercises, Therapeutic activity, Neuromuscular re-education, Balance training, Gait training, Patient/Family education, Self Care, Joint mobilization, Joint manipulation, Vestibular training, Canalith repositioning, Orthotic/Fit training, DME instructions, Dry Needling, Electrical stimulation, Spinal manipulation, Spinal mobilization, Cryotherapy, Moist heat, Taping, Traction, Ultrasound, Ionotophoresis 4mg /ml Dexamethasone, Manual therapy, and Re-evaluation.  PLAN FOR NEXT SESSION: Manual techniques for symptom modulation prn, bilateral/unilateral distraction. Progress with flexion-bias exercise and use of repeated flexion in lying as primary movement.  F/u on response with dry needling + e-stim.      Consuela Mimes, PT, DPT #[id number]  Gertie Exon, PT 12/09/2022, 4:01 PM

## 2022-12-14 ENCOUNTER — Encounter: Payer: BC Managed Care – PPO | Admitting: Physical Therapy

## 2022-12-16 ENCOUNTER — Ambulatory Visit: Payer: BC Managed Care – PPO

## 2022-12-16 DIAGNOSIS — R262 Difficulty in walking, not elsewhere classified: Secondary | ICD-10-CM

## 2022-12-16 DIAGNOSIS — M5416 Radiculopathy, lumbar region: Secondary | ICD-10-CM | POA: Diagnosis not present

## 2022-12-16 DIAGNOSIS — M5459 Other low back pain: Secondary | ICD-10-CM

## 2022-12-16 NOTE — Therapy (Signed)
OUTPATIENT PHYSICAL THERAPY TREATMENT   Patient Name: Beth Krause MRN: [id number] DOB:22-Jun-1960, 62 y.o., female Today's Date: 12/16/2022   END OF SESSION:  PT End of Session - 12/16/22 1604     Visit Number 7    Number of Visits 13    Date for PT Re-Evaluation 12/30/22    Authorization Type [employer], VL 60 combined PT/OT, plan year 05/03/22-05/02/23              No past medical history on file. No past surgical history on file. 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 Her ability to tolerate prolonged standing tasks at work remains generally poor. Pt has no confident opinion at this point regarding helpfulness of dry needling interventions. Pt has been consistent with home exercise assignments, denies any part of HEP being exacerbating to her symptoms.   Pain: 8/10 today (see diagram below)     PERTINENT HISTORY: Pt is a 62 year old female referred  for back and radicular pain secondary to disc herniation and stenosis. She is status post right L3 L2-3 and left L3-4 TFESI 10/11/2022 with temporary improvement. Pain is worse with standing and walking and better with sitting. Hx of PT in May to June without much improvement. Pt works as PT - has worked for 35 years.   Pt reports symptoms started in beginning of May. Pt reports back pain episode in 1994 in with MRI showing slight disc bulge. Pt reports walking up to 4-5 mi per day usually and completing elliptical/spin bike. She reports insidious atraumatic onset of pain in back with pain into R buttock region and R leg. Pt started with [facility]. Pt had 2 sets of ESI at L3-4 and L4-5 without resolution of symptoms. 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 Patient has to perform MaxA transfers at work. She has difficulty with long-distance walking. Pt reports constant feeling of stiffness. Pt reports no disturbed sleep due to her pain. No unexplained weight change. No benefit from Tramadol, muscle relaxers. Pt denies changes in bathroom habits.Pt reports continuous tingling along L3 distribution.  Pt reports that lumbar roll does not work usually. Pt reports feeling good in prone/prone on elbows.     PRECAUTIONS: None  WEIGHT BEARING RESTRICTIONS: No  FALLS: Has patient fallen in last 6 months? 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  Hobbies: Walking exercise, elliptical exercise routine  Patient Goals: Able to complete walking exercise pain-free; able to stand at public venues/publuc outings    OBJECTIVE:  ROM Assessment Hip     Right 12/16/22 Left 12/16/22  Flexion Supine 120 (> in ABDCT) 122 (> in ABDCT)  External rotation (90/90) 65 55  Internal Rotation (90/90) 12  25  Hip ABDCT in supine >35 degrees  >35 degrees  Extension ROM Defer to later session Defer to later session      TODAY'S TREATMENT: DATE: 12/16/2022  -AA/ROM BLE Nustep c heat applied to back 3 minutes  -HIP ROM assessment as above   -Hooklying, legs supported: Rt myofascial sustained release to Rt lumbar extensors x3 places, posterior ilium gluteals x2, piriformis spasm x2 (excellent release), and deeper posterior hip external rotators; -Rt PGOGOQ stretch P/ROM 3x45sec -screening deep palpation of Rt hamstrings attachment to ischial tuberosity, negative for any relevant tenderness  Supine lumbar flexion stretch x3 minutes (hooklying on plinth with foot of table elevated to end range hip flexion) -isometric deadbug x20 seconds, legs only (well tolerated, no focal motor weakness in core, no significant pain until eccentric release)  Supine resisted exercises on plinth as follows: -RLE manually resisted leg press x12 -RLE manually resisted hip extension 30 degrees to zero x12 -Black TB clams x15 -RLE manually resisted leg press x12 -"hamstrings" bridge (feet in chair, knees slightly bent) 1x12 -Band clam blackTB + BlueTB x15  -Hooklying bridge x12  Pt down to 5/10 by end of session, with slight increase with return to weightbearing.   PATIENT EDUCATION:  Education details: potential role of adjacent resistance training in partial symptoms management.  Person educated: Patient Education method: Explanation, Demonstration, and Handouts Education comprehension: verbalized understanding and returned demonstration   HOME EXERCISE PROGRAM:  Access Code: [email or web address] URL: [email or web address] Date: 11/24/2022 Prepared by: Consuela Mimes  Exercises - Supine Double Knee to Chest  - 5-6 x daily - 7 x weekly - 1 sets - 10 reps - 1sec hold - Hooklying Lumbar Traction  - 7 x weekly - 10 reps - 5-10sec hold - Supine 90/90 Sciatic Nerve Glide with Knee Flexion/Extension  -  2 x daily - 7 x weekly - 2 sets - 10 reps - 1-2sec hold - Supine Piriformis Stretch with Foot on Ground  - 2 x daily - 7 x weekly - 3 sets - 30sec hold   ASSESSMENT:  CLINICAL IMPRESSION: Symptoms flared on arrival, used stepper to achieve basic AA/ROM of hips, then assessment of bilat hip mobility, generally well tolerated, un concerning for hip-specific pathology, however still need to assess extension ROM as I suspect deficits being contributory to lumbar stenosis in standing by the theoretical principles of adjacent segment disorder or regional interdependence. Pt has mild to areas of deep spasms along the right posterior chain, approaching allodynia response at Rt piriformis, however given reduced sensitivity after tissue release and lengthening, suspect more related to myofascial pain, as a true radicular/neuropathic allodynic presentation would not be so fluctuating. Focused on increased loading volume of posterior chain thereafter, moderate intensity with progressive reps- surprisingly good tolerance with all which when contextualized with quick onset increase with subsequent return to standing, I suspect active control of pelvic tilt to be a significant factor. Good correlation with response to well tolerated isometric deadbug with increased pain during eccentric return to hooklying, weight of BLE exceeding capacity of abdominal muscles to maintain isometric lumbar curvature as the moment are is lengthening during eccentric hip extension.  Pt will continue to benefit from skilled PT services to address deficits and improve function.   OBJECTIVE IMPAIRMENTS: decreased mobility, difficulty walking, decreased ROM, decreased strength, impaired flexibility, and pain.   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   REHAB POTENTIAL: Good  CLINICAL DECISION MAKING: Evolving/moderate complexity  EVALUATION COMPLEXITY: Moderate   GOALS: Goals reviewed with patient? Yes  SHORT TERM GOALS: Target date: 12/06/2022  Pt will be independent with HEP in order to improve strength and decrease back pain to improve pain-free function at home and work. Baseline: 11/15/22: Baseline HEP initiated.  Goal status: INITIAL   LONG TERM GOALS: Target date: 12/30/2022  Pt will increase FOTO to at least 61 to demonstrate significant improvement in function at home and work related to back pain  Baseline: 11/15/22: 46 Goal status: INITIAL  2.  By 4 weeks, will decrease worst back pain by at least 2 points on the NPRS in order to demonstrate clinically significant reduction in back pain. Baseline: 11/15/22: 10/10 pain at worst Goal status: INITIAL  3.  Pt will complete standing/ambulatory activity up to half of her work day without exacerbation of back pain and NPRS < 2/10 as needed for community-level mobility and completion of work duties Baseline: 11/15/22: Difficulty standing more than a couple of minutes Goal status: INITIAL  4.  Patient will have full thoracolumbar AROM without reproduction of pain as needed for reaching items on ground, household chores, bending. Baseline: 11/15/22: Motion loss into flexion, extension, R/L lateral flexion, R rotation.  Goal status: INITIAL  5.  By completion of POC, will decrease worst back pain to no more than 2-3/10 on the NPRS in order to demonstrate clinically significant reduction in back pain and improved tolerance of work duties and community-level functional mobility. Baseline: 11/15/22: 10/10 pain at worst Goal status: INITIAL   PLAN: PT FREQUENCY: 1-2x/week  PT DURATION: 6  weeks  PLANNED INTERVENTIONS: Therapeutic exercises, Therapeutic activity, Neuromuscular re-education, Balance training, Gait training, Patient/Family education, Self Care, Joint mobilization, Joint manipulation, Vestibular training, Canalith repositioning, Orthotic/Fit training, DME instructions, Dry Needling, Electrical stimulation, Spinal manipulation, Spinal mobilization, Cryotherapy, Moist heat, Taping, Traction, Ultrasound, Ionotophoresis 4mg /ml Dexamethasone, Manual therapy, and Re-evaluation.  PLAN FOR NEXT SESSION: return to piriformis manual release, heavy hip/trunk flexion resistance training, biocuff stabilization training in hooklying/bird-dogging; heavy resistance stnading hip fleixon in standing; stairs lunge hip extension stretching with neutral lumbar/trunk posturing.    9:03 AM, 12/17/22 Rosamaria Lints, PT, DPT Physical Therapist - [place] [phone] (Office)   Huson C, PT 12/16/2022, 4:07 PM

## 2022-12-21 ENCOUNTER — Ambulatory Visit: Payer: BC Managed Care – PPO | Admitting: Physical Therapy

## 2022-12-21 ENCOUNTER — Encounter: Payer: Self-pay | Admitting: Physical Therapy

## 2022-12-21 DIAGNOSIS — R262 Difficulty in walking, not elsewhere classified: Secondary | ICD-10-CM

## 2022-12-21 DIAGNOSIS — M5416 Radiculopathy, lumbar region: Secondary | ICD-10-CM

## 2022-12-21 DIAGNOSIS — M5459 Other low back pain: Secondary | ICD-10-CM

## 2022-12-21 NOTE — Therapy (Signed)
OUTPATIENT PHYSICAL THERAPY TREATMENT   Patient Name: Beth Krause MRN: [id number] DOB:04/08/1960, 62 y.o., female Today's Date: 12/21/2022   END OF SESSION:  PT End of Session - 12/27/22 0913     Visit Number 8    Number of Visits 13    Date for PT Re-Evaluation 12/30/22    Authorization Type [employer], VL 60 combined PT/OT, plan year 05/03/22-05/02/23    PT Start Time 1600    PT Stop Time 1650    PT Time Calculation (min) 50 min    Behavior During Therapy [facility] for tasks assessed/performed               History reviewed. No pertinent past medical history. History reviewed. No pertinent surgical history. 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 Mariah Milling 11/25/22  PERTINENT HISTORY: Pt is a 62 year old female referred for back and radicular pain secondary to disc herniation and stenosis. She is status post right L3 L2-3 and left L3-4 TFESI 10/11/2022 with temporary improvement. Pain is worse with standing and walking and better with sitting. Hx of PT in May to June without much improvement. Pt works as PT - has worked for 35 years.    Pt reports symptoms started in beginning of May. Pt reports back pain episode in 1994 in with MRI showing slight disc bulge. Pt reports walking up to 4-5 mi per day usually and completing elliptical/spin bike. She reports insidious atraumatic onset of pain in back with pain into R buttock region and R leg. Pt started with [facility]. Pt had 2 sets of ESI at L3-4 and L4-5 without resolution of symptoms. 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 Patient has to perform MaxA transfers at work. She has difficulty with long-distance walking. Pt reports constant feeling of stiffness. Pt reports no disturbed sleep due to her pain. No unexplained weight change. No benefit from Tramadol, muscle relaxers. Pt denies changes in bathroom habits.Pt reports continuous tingling along L3 distribution.  Pt reports that lumbar roll does not work usually. Pt reports feeling good in prone/prone on elbows.   PAIN:    Pain Intensity: Present: 0/10, Best: 0/10, Worst: 10/10 Pain location: R buttock and down to R thigh/distomedial R thigh; intermittent referral to L side of back Pain Quality: Sharp pain in back, tingling into thigh region  Radiating: Yes , radiating to buttock and R thigh Numbness/Tingling: Yes; R anterior thigh  Focal Weakness: No Aggravating factors: prolonged walking, getting out of bed first thing, prolonged standing/static standing  Relieving factors: on the move, prone/prone on elbows 24-hour pain behavior: worse in AM, bad in evening; better with movement in middle of day How long can  you stand: No more than a couple of minutes History of prior back injury, pain, surgery, or therapy: Yes; one injury in 90s that resolved without treatment   Imaging: Yes , MRI 08/26/22  IMPRESSION: 1.  Moderate spinal stenosis at L3-L4 with bilateral foraminal disc protrusions causing severe right neural foraminal stenosis. Modic type I endplate changes at this level. 2.  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 3.  Right facet capsular distention from L2-L3 to L4-L5. Increase T2 signal in the right L3 and L4 pedicles may be reactive to acute stress related changes.    Red flags: Negative for bowel/bladder changes, saddle paresthesia, personal history of cancer, h/o spinal tumors, h/o compression fx, h/o abdominal aneurysm, abdominal pain, chills/fever, night sweats, nausea, vomiting, unrelenting pain, first onset of insidious LBP <20 y/o  PRECAUTIONS: None  WEIGHT BEARING RESTRICTIONS: No  FALLS: Has patient fallen in last 6 months? 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   Hobbies: Walking exercise, elliptical exercise routine  Patient Goals: Able to complete walking exercise pain-free; able to stand at public venues/publuc outings    OBJECTIVE:  GAIT: Distance walked: 40 ft Assistive device utilized: None Level of assistance: Complete Independence Comments: Symmetrical weightbearing, upright guarded posture   Posture: Lumbar lordosis: WNL Iliac crest height: Equal bilaterally Lumbar lateral shift: to L side   AROM AROM (Normal range in degrees) AROM   Lumbar   Flexion (65) 75% (pull in hamstrings)  Extension (30) 50% ("usually that hurts")  Right lateral flexion (25) 50%* (leg pain)  Left lateral flexion (25) 50%  Right rotation (30) 75%  Left rotation (30) WNL      Hip Right Left  Flexion (125)    Extension (15)    Abduction (40)    Adduction     Internal Rotation (45)    External Rotation (45)        (* = pain; Blank rows = not tested)  LE MMT: MMT (out of 5) Right  Left   Hip flexion    Hip extension    Hip abduction    Hip adduction    Hip internal rotation    Hip external rotation    Knee  flexion    Knee extension    Ankle dorsiflexion    Ankle plantarflexion    Ankle inversion    Ankle eversion    (* = pain; Blank rows = not tested)  Sensation Dec light touch R anterior thigh   Muscle Length Hamstrings: R: Positive L: Negative Ely (quadriceps): R: Not examined L: Not examined Thomas (hip flexors): R: Not examined L: Not examined   Palpation Location Right Left         Lumbar paraspinals 1 0  Quadratus Lumborum    Gluteus Maximus 1 0  Gluteus Medius    Deep hip external rotators    PSIS 1   Fortin's Area (SIJ) 0   Greater Trochanter 0   (Blank rows = not tested) Graded on 0-4 scale (0 = no pain, 1 = pain, 2 = pain with wincing/grimacing/flinching, 3 = pain with withdrawal, 4 = unwilling to allow palpation)   Passive Accessory Intervertebral Motion 11/17/22: Hypomobility with CPA L4-S1 and T7-T12. Pain prior to restriction at lower lumbar segments.  Special Tests Lumbar Radiculopathy and Discogenic: Slump (SN 83, -LR 0.32): R: Positive for L-sided back pain;  L: Positive for L-sided back pain SLR (SN 92, -LR 0.29): R: Negative L:  Negative  Facet Joint: Extension-Rotation (SN 100, -LR 0.0): R: Positive L: Positive  Lumbar Foraminal Stenosis: Lumbar quadrant (SN 70): R: Positive L: Positive   -No symptom reduction with hooklying general manual lumbar traction    TODAY'S TREATMENT: DATE: 12/27/2022    SUBJECTIVE STATEMENT:   Patient reports ongoing significant pain with standing. Patient reports 9/10 pain at arrival to PT. Patient repots numbness/tingling down to her R knee at the moment. No L lower limb pain at the moment. Patient reports completing her home exercises without any difference afterward. Pt reports doing fine after sitting. 9/10 pain this afternoon. Pt reports no significant or modest benefit from dry needling with e-stim.     Manual Therapy - for symptom modulation, soft tissue sensitivity and mobility, joint mobility, ROM     Unilateral R long-leg distraction for symptom modulation/nerve root decompression; x 3 minutes Bilateral LE traction with pt in hooklying for nerve root decompression, with Mulligan belt, supine; 10 sec intermittent holds; x 8 minutes DTM/TPR along R gluteus medius; x 3 minutes  *not today* STM R>L lumbar erector spine L3-S1 and R gluteus maximus/medius; x 10 minutes    Therapeutic Exercise - for improved soft tissue flexibility and extensibility as needed for ROM, improved strength as needed to improve performance of CKC activities/functional movements  Repeated flexion in lying, with clinician overpressure; 2 x 10   -no change in symptoms during, no effect after    Piriformis stretch; reviewed  Ambulate laps around gym x 2 to re-test tolerance of stepping/gait  -return of RLE paresthesias and R-sided pain with first lap   Manually resisted leg press; 2 x 10  -reviewed use of Tband for self-resisted leg press in supine   AROM screen Lumbar flexion  WFL Lumbar extension 100%* (R-sided back pain)  Lateral flexion: R 50%*, L 100% Thoracolumbar rotation: R 50%, L 75%   PATIENT EDUCATION: We discussed posterior chain strengthening exercises and discussed progressing with these as tolerated with use of lying exercises and physioball drills. I recommended following up with Dr. Mariah Milling given severe symptoms and significant effect on level of function and QoL.    *not today* Lower trunk rotation; x10 alternating R/L Supine sciatic nerve flossing; reviewed for technique Supine piriformis stretch; 2 x 30 sec on each side  -for HEP update Repeated extension in lying; 2x10    PATIENT EDUCATION:  Education details: see above for patient education details Person educated: Patient Education method: Explanation, Demonstration, and Handouts Education comprehension: verbalized understanding and returned demonstration   HOME EXERCISE PROGRAM:  Access Code: [email or web address] URL:  [email or web address] Date: 11/24/2022 Prepared by: Consuela Mimes  Exercises - Supine Double Knee to Chest  - 5-6 x daily - 7 x weekly - 1 sets - 10 reps - 1sec hold - Hooklying Lumbar Traction  - 7 x weekly - 10 reps - 5-10sec hold - Supine 90/90 Sciatic Nerve Glide with Knee Flexion/Extension  - 2 x daily - 7 x weekly - 2 sets - 10 reps - 1-2sec hold - Supine Piriformis Stretch with Foot on Ground  - 2 x daily - 7 x weekly - 3 sets - 30sec hold   ASSESSMENT:  CLINICAL IMPRESSION: We have completed 2 trials of dry needling with e-stim with no notable benefit. Pt does clearly have resolution of  symptoms with flexion including leaning forward on cart when shopping, sitting, and leaning onto counter/table. Pt has clear clinical signs of lumbar spinal stenosis. We completed further manual therapy to address taut/tender gluteal musculature which can contribute to lower extremity referred symptoms and iliolumbar pain. We discussed continuing with regular posterior chain strengthening exercises in gravity-eliminated positions and provided Tband for leg press that can be performed in supine at home (pt reports benefit of completing leg pressing last visit). Given ongoing significant symptoms and effect on QoL, we did recommend following up with referring provider. Pt presents with current impairments in: thoracolumbar AROM, posterior chain soft tissue extensibility/dural mobility, R-sided back pain with R lower limb referral pattern, and mild tautness/tenderness of R lumbar paraspinals/gluteal mm. Pt will continue to benefit from skilled PT services to address deficits and improve function.   OBJECTIVE IMPAIRMENTS: decreased mobility, difficulty walking, decreased ROM, decreased strength, impaired flexibility, and pain.   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   REHAB POTENTIAL: Good  CLINICAL DECISION MAKING: Evolving/moderate complexity  EVALUATION COMPLEXITY: Moderate   GOALS: Goals reviewed with patient? Yes  SHORT TERM GOALS: Target date: 12/06/2022  Pt will be independent with HEP in order to improve strength and decrease back pain to improve pain-free function at home and work. Baseline: 11/15/22: Baseline HEP initiated.  Goal status: INITIAL   LONG TERM GOALS: Target date: 12/30/2022  Pt will increase FOTO to at least 61 to demonstrate significant improvement in function at home and work related to back pain  Baseline: 11/15/22: 46 Goal status: INITIAL  2.  By 4 weeks, will decrease worst back pain by at least 2 points on the NPRS in order to demonstrate clinically significant reduction in back pain. Baseline: 11/15/22: 10/10 pain at worst Goal status: INITIAL  3.  Pt will complete standing/ambulatory activity up to half of her work day without exacerbation of back pain and NPRS < 2/10 as needed for community-level mobility and completion of work duties Baseline: 11/15/22: Difficulty standing more than a couple of minutes Goal status: INITIAL  4.  Patient will have full thoracolumbar AROM without reproduction of pain as needed for reaching items on ground, household chores, bending. Baseline: 11/15/22: Motion loss into flexion, extension, R/L lateral flexion, R rotation.  Goal status: INITIAL  5.  By completion of POC, will decrease worst back pain to no more than 2-3/10 on the NPRS in order to demonstrate clinically significant reduction in back pain and improved tolerance of work duties and community-level functional mobility. Baseline: 11/15/22: 10/10 pain at worst Goal  status: INITIAL   PLAN: PT FREQUENCY: 1-2x/week  PT DURATION: 6 weeks  PLANNED INTERVENTIONS: Therapeutic exercises, Therapeutic activity, Neuromuscular re-education, Balance training, Gait training, Patient/Family education, Self Care, Joint mobilization, Joint manipulation, Vestibular training, Canalith repositioning, Orthotic/Fit training, DME instructions, Dry Needling, Electrical stimulation, Spinal manipulation, Spinal mobilization, Cryotherapy, Moist heat, Taping, Traction, Ultrasound, Ionotophoresis 4mg /ml Dexamethasone, Manual therapy, and Re-evaluation.  PLAN FOR NEXT SESSION: Manual techniques for symptom modulation prn, bilateral/unilateral distraction. Progress with flexion-bias exercise and use of repeated flexion in lying as primary movement.      Consuela Mimes, PT, DPT #[id number]  Gertie Exon, PT 12/27/2022, 9:13 AM

## 2022-12-23 ENCOUNTER — Encounter: Payer: BC Managed Care – PPO | Admitting: Physical Therapy

## 2022-12-28 ENCOUNTER — Ambulatory Visit: Payer: BC Managed Care – PPO | Admitting: Physical Therapy

## 2022-12-28 DIAGNOSIS — M5459 Other low back pain: Secondary | ICD-10-CM

## 2022-12-28 DIAGNOSIS — M5416 Radiculopathy, lumbar region: Secondary | ICD-10-CM

## 2022-12-28 DIAGNOSIS — R262 Difficulty in walking, not elsewhere classified: Secondary | ICD-10-CM

## 2022-12-28 NOTE — Therapy (Unsigned)
OUTPATIENT PHYSICAL THERAPY TREATMENT   Patient Name: Beth Krause MRN: [id number] DOB:1960/11/09, 62 y.o., female Today's Date: 12/28/2022   END OF SESSION:  PT End of Session - 12/28/22 1552     Visit Number 9    Number of Visits 13    Date for PT Re-Evaluation 12/30/22    Authorization Type [employer], VL 60 combined PT/OT, plan year 05/03/22-05/02/23    PT Start Time 1602    PT Stop Time 1645    PT Time Calculation (min) 43 min    Behavior During Therapy [facility] for tasks assessed/performed                No past medical history on file. No past surgical history on file. 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 Mariah Milling 11/25/22  PERTINENT HISTORY: Pt is a 62 year old female referred for back and radicular pain secondary to disc herniation and stenosis. She is status post right L3 L2-3 and left L3-4 TFESI 10/11/2022 with temporary improvement. Pain is worse with standing and walking and better with sitting. Hx of PT in May to June without much improvement. Pt works as PT - has worked for 35 years.   Pt reports symptoms started in  beginning of May. Pt reports back pain episode in 1994 in with MRI showing slight disc bulge. Pt reports walking up to 4-5 mi per day usually and completing elliptical/spin bike. She reports insidious atraumatic onset of pain in back with pain into R buttock region and R leg. Pt started with [facility]. Pt had 2 sets of ESI at L3-4 and L4-5 without resolution of symptoms. 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 Patient has to perform MaxA transfers at work. She has difficulty with long-distance walking. Pt reports constant feeling of stiffness. Pt reports no disturbed sleep due to her pain. No unexplained weight change. No benefit from Tramadol, muscle relaxers. Pt denies changes in bathroom habits.Pt reports continuous tingling along L3 distribution.  Pt reports that lumbar roll does not work usually. Pt reports feeling good in prone/prone on elbows.   PAIN:    Pain Intensity: Present: 0/10, Best: 0/10, Worst: 10/10 Pain location: R buttock and down to R thigh/distomedial R thigh; intermittent referral to L side of back Pain Quality: Sharp pain in back, tingling into thigh region  Radiating: Yes , radiating to buttock and R thigh Numbness/Tingling: Yes; R anterior thigh  Focal Weakness: No Aggravating factors: prolonged walking, getting out of bed first thing, prolonged standing/static standing  Relieving factors: on the move, prone/prone on elbows 24-hour pain behavior: worse in AM, bad in evening; better with movement in middle of day How long can  you stand: No more than a couple of minutes History of prior back injury, pain, surgery, or therapy: Yes; one injury in 90s that resolved without treatment   Imaging: Yes , MRI 08/26/22  IMPRESSION: 1.  Moderate spinal stenosis at L3-L4 with bilateral foraminal disc protrusions causing severe right neural foraminal stenosis. Modic type I endplate changes at this level. 2.  6 x 8 mm synovial cyst on the left at L5-S1 abutting  the traversing S1 nerve root. 3.  Right facet capsular distention from L2-L3 to L4-L5. Increase T2 signal in the right L3 and L4 pedicles may be reactive to acute stress related changes.    Red flags: Negative for bowel/bladder changes, saddle paresthesia, personal history of cancer, h/o spinal tumors, h/o compression fx, h/o abdominal aneurysm, abdominal pain, chills/fever, night sweats, nausea, vomiting, unrelenting pain, first onset of insidious LBP <20 y/o  PRECAUTIONS: None  WEIGHT BEARING RESTRICTIONS: No  FALLS: Has patient fallen in last 6 months? 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   Hobbies: Walking exercise, elliptical exercise routine  Patient Goals: Able to complete walking exercise pain-free; able to stand at public venues/publuc outings    OBJECTIVE:  GAIT: Distance walked: 40 ft Assistive device utilized: None Level of assistance: Complete Independence Comments: Symmetrical weightbearing, upright guarded posture   Posture: Lumbar lordosis: WNL Iliac crest height: Equal bilaterally Lumbar lateral shift: to L side   AROM AROM (Normal range in degrees) AROM   Lumbar   Flexion (65) 75% (pull in hamstrings)  Extension (30) 50% ("usually that hurts")  Right lateral flexion (25) 50%* (leg pain)  Left lateral flexion (25) 50%  Right rotation (30) 75%  Left rotation (30) WNL      Hip Right Left  Flexion (125)    Extension (15)    Abduction (40)    Adduction     Internal Rotation (45)    External Rotation (45)        (* = pain; Blank rows = not tested)  LE MMT: MMT (out of 5) Right  Left   Hip flexion    Hip extension    Hip abduction    Hip adduction    Hip internal rotation    Hip external rotation    Knee flexion    Knee extension    Ankle  dorsiflexion    Ankle plantarflexion    Ankle inversion    Ankle eversion    (* = pain; Blank rows = not tested)  Sensation Dec light touch R anterior thigh   Muscle Length Hamstrings: R: Positive L: Negative Ely (quadriceps): R: Not examined L: Not examined Thomas (hip flexors): R: Not examined L: Not examined   Palpation Location Right Left         Lumbar paraspinals 1 0  Quadratus Lumborum    Gluteus Maximus 1 0  Gluteus Medius    Deep hip external rotators    PSIS 1   Fortin's Area (SIJ) 0   Greater Trochanter 0   (Blank rows = not tested) Graded on 0-4 scale (0 = no pain, 1 = pain, 2 = pain with wincing/grimacing/flinching, 3 = pain with withdrawal, 4 = unwilling to allow palpation)   Passive Accessory Intervertebral Motion 11/17/22: Hypomobility with CPA L4-S1 and T7-T12. Pain prior to restriction at lower lumbar segments.  Special Tests Lumbar Radiculopathy and Discogenic: Slump (SN 83, -LR 0.32): R: Positive for L-sided back pain;  L: Positive for L-sided back pain SLR (SN 92, -LR 0.29): R: Negative L:  Negative  Facet Joint: Extension-Rotation (SN 100, -LR 0.0): R: Positive L: Positive  Lumbar Foraminal Stenosis: Lumbar quadrant (SN 70): R: Positive L: Positive   -No symptom reduction with hooklying general manual lumbar traction    TODAY'S TREATMENT: DATE: 12/28/2022    SUBJECTIVE STATEMENT:   Patient reports feeling more pain affecting her R hip/buttock region described as burning pain versus her back. Patient reports burning down length of RLE. Patient reports tingling is continuous evening in sitting position today. Patient feels "we're not getting the right spot." Patient reports 7/10 pain at arrival to PT. Patient reports that knee to chest doesn't help, but it doesn't hurt. Patient reports completing posterior chain strengthening exercises at home as well. Pt reports tolerating spin bike relatively well on Sunday without exacerbation of  symptoms.     Manual Therapy - for symptom modulation, soft tissue sensitivity and mobility, joint mobility, ROM    Unilateral R long-leg distraction for symptom modulation/nerve root decompression; x 3 minutes Bilateral LE traction with pt in hooklying for nerve root decompression, with Mulligan belt, supine; 10 sec intermittent holds; x 8 minutes DTM/TPR along R gluteus medius; x 3 minutes  *not today* STM R>L lumbar erector spine L3-S1 and R gluteus maximus/medius; x 10 minutes    Therapeutic Exercise - for improved soft tissue flexibility and extensibility as needed for ROM, improved strength as needed to improve performance of CKC activities/functional movements  Repeated flexion in lying, reviewed Posterior pelvic tilt to bridge; 2x10  Up-up-down-down; supine march variation; x10 alternating R/L  Ambulate laps around gym x 2 to re-test tolerance of stepping/gait  -return of RLE paresthesias and R-sided pain with first lap   Manually resisted leg press; 2 x 10  -reviewed use of Tband for self-resisted leg press in supine   AROM screen Lumbar flexion  WFL Lumbar extension 100%* (R-sided back pain)  Lateral flexion: R 50%*, L 100% Thoracolumbar rotation: R 50%, L 75%   PATIENT EDUCATION: We discussed posterior chain strengthening exercises and discussed progressing with these as tolerated with use of lying exercises and physioball drills. I recommended following up with Dr. Mariah Milling given severe symptoms and significant effect on level of function and QoL.    *not today* Lower trunk rotation; x10 alternating R/L Supine sciatic nerve flossing; reviewed for technique Supine piriformis stretch; 2 x 30 sec on each side  -for HEP update Repeated extension in lying; 2x10    PATIENT EDUCATION:  Education details: see above for patient education details Person educated: Patient Education method: Explanation, Demonstration, and Handouts Education comprehension:  verbalized understanding and returned demonstration   HOME EXERCISE PROGRAM:  Access Code: [email or web address] URL: [email or web address] Date: 12/28/2022 Prepared by: Consuela Mimes  Exercises - Supine Double Knee to Chest  - 5-6 x daily - 7 x weekly - 1 sets - 10 reps - 1sec hold - Hooklying Lumbar Traction  - 7 x weekly - 10 reps - 5-10sec hold - Supine 90/90 Sciatic Nerve Glide with Knee Flexion/Extension  - 2 x daily - 7 x weekly - 2 sets - 10 reps - 1-2sec hold - Supine Piriformis Stretch with Foot on Ground  - 2 x daily - 7 x weekly - 3 sets - 30sec hold - Supine Bridge  - 1 x daily - 7 x weekly -  2 sets - 10 reps - Supine March  - 1 x daily - 7 x weekly - 2 sets - 10 reps - Swiss Ball March  - 1 x daily - 7 x weekly - 2 sets - 10 reps   ASSESSMENT:  CLINICAL IMPRESSION: We have completed 2 trials of dry needling with e-stim with no notable benefit. Pt does clearly have resolution of symptoms with flexion including leaning forward on cart when shopping, sitting, and leaning onto counter/table. Pt has clear clinical signs of lumbar spinal stenosis. We completed further manual therapy to address taut/tender gluteal musculature which can contribute to lower extremity referred symptoms and iliolumbar pain. We discussed continuing with regular posterior chain strengthening exercises in gravity-eliminated positions and provided Tband for leg press that can be performed in supine at home (pt reports benefit of completing leg pressing last visit). Given ongoing significant symptoms and effect on QoL, we did recommend following up with referring provider. Pt presents with current impairments in: thoracolumbar AROM, posterior chain soft tissue extensibility/dural mobility, R-sided back pain with R lower limb referral pattern, and mild tautness/tenderness of R lumbar paraspinals/gluteal mm. Pt will continue to benefit from skilled PT services to address deficits and improve  function.   OBJECTIVE IMPAIRMENTS: decreased mobility, difficulty walking, decreased ROM, decreased strength, impaired flexibility, and pain.   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   REHAB POTENTIAL: Good  CLINICAL DECISION MAKING: Evolving/moderate complexity  EVALUATION COMPLEXITY: Moderate   GOALS: Goals reviewed with patient? Yes  SHORT TERM GOALS: Target date: 12/06/2022  Pt will be independent with HEP in order to improve strength and decrease back pain to improve pain-free function at home and work. Baseline: 11/15/22: Baseline HEP initiated.  Goal status: INITIAL   LONG TERM GOALS: Target date: 12/30/2022  Pt will increase FOTO to at least 61 to demonstrate significant improvement in function at home and work related to back pain  Baseline: 11/15/22: 46 Goal status: INITIAL  2.  By 4 weeks, will decrease worst back pain by at least 2 points on the NPRS in order to demonstrate clinically significant reduction in back pain. Baseline: 11/15/22: 10/10 pain at worst Goal status: INITIAL  3.  Pt will complete standing/ambulatory activity up to half of her work day without exacerbation of back pain and NPRS < 2/10 as needed for community-level mobility and completion of work duties Baseline: 11/15/22: Difficulty standing more than a couple of minutes Goal status: INITIAL  4.  Patient will have full thoracolumbar AROM without reproduction of pain as needed for reaching items on ground, household chores, bending. Baseline: 11/15/22: Motion loss into flexion, extension, R/L lateral flexion, R rotation.  Goal  status: INITIAL  5.  By completion of POC, will decrease worst back pain to no more than 2-3/10 on the NPRS in order to demonstrate clinically significant reduction in back pain and improved tolerance of work duties and community-level functional mobility. Baseline: 11/15/22: 10/10 pain at worst Goal status: INITIAL   PLAN: PT FREQUENCY: 1-2x/week  PT DURATION: 6 weeks  PLANNED INTERVENTIONS: Therapeutic exercises, Therapeutic activity, Neuromuscular re-education, Balance training, Gait training, Patient/Family education, Self Care, Joint mobilization, Joint manipulation, Vestibular training, Canalith repositioning, Orthotic/Fit training, DME instructions, Dry Needling, Electrical  stimulation, Spinal manipulation, Spinal mobilization, Cryotherapy, Moist heat, Taping, Traction, Ultrasound, Ionotophoresis 4mg /ml Dexamethasone, Manual therapy, and Re-evaluation.  PLAN FOR NEXT SESSION: Manual techniques for symptom modulation prn, bilateral/unilateral distraction. Progress with flexion-bias exercise and use of repeated flexion in lying as primary movement.      Consuela Mimes, PT, DPT #[id number]  Gertie Exon, PT 12/28/2022, 3:52 PM

## 2022-12-29 ENCOUNTER — Encounter: Payer: Self-pay | Admitting: Physical Therapy

## 2023-01-04 ENCOUNTER — Ambulatory Visit: Payer: BC Managed Care – PPO | Attending: Physical Medicine & Rehabilitation | Admitting: Physical Therapy

## 2023-01-04 DIAGNOSIS — M5459 Other low back pain: Secondary | ICD-10-CM | POA: Diagnosis present

## 2023-01-04 DIAGNOSIS — R262 Difficulty in walking, not elsewhere classified: Secondary | ICD-10-CM | POA: Insufficient documentation

## 2023-01-04 DIAGNOSIS — M5416 Radiculopathy, lumbar region: Secondary | ICD-10-CM | POA: Insufficient documentation

## 2023-01-04 NOTE — Therapy (Unsigned)
OUTPATIENT PHYSICAL THERAPY TREATMENT AND PROGRESS NOTE/RE-CERTIFICATION   Dates of reporting period  11/15/22   to   01/04/23    Patient Name: Beth Krause MRN: [id number] DOB:September 12, 1960, 62 y.o., female Today's Date: 01/04/2023   END OF SESSION:  PT End of Session - 01/05/23 1357     Visit Number 10    Number of Visits 13    Date for PT Re-Evaluation 12/30/22    Authorization Type [employer], VL 60 combined PT/OT, plan year 05/03/22-05/02/23    PT Start Time 1611    PT Stop Time 1655    PT Time Calculation (min) 44 min    Activity Tolerance Patient tolerated treatment well    Behavior During Therapy [facility] for tasks assessed/performed              History reviewed. No pertinent past medical history. History reviewed. No pertinent surgical history. 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 Mariah Milling 11/25/22  PERTINENT HISTORY: Pt is a 62 year old female referred for back and radicular pain secondary to disc herniation and stenosis. She is status post right L3 L2-3 and left L3-4 TFESI 10/11/2022 with temporary improvement. Pain is  worse with standing and walking and better with sitting. Hx of PT in May to June without much improvement. Pt works as PT - has worked for 35 years.   Pt reports symptoms started in beginning of May. Pt reports back pain episode in 1994 in with MRI showing slight disc bulge. Pt reports walking up to 4-5 mi per day usually and completing elliptical/spin bike. She reports insidious atraumatic onset of pain in back with pain into R buttock region and R leg. Pt started with [facility]. Pt had 2 sets of ESI at L3-4 and L4-5 without resolution of symptoms. 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 Patient has to perform MaxA transfers at work. She has difficulty with long-distance walking. Pt reports constant feeling of stiffness. Pt reports no disturbed sleep due to her pain. No unexplained weight change. No benefit from Tramadol, muscle relaxers. Pt denies changes in bathroom habits.Pt reports continuous tingling along L3 distribution.  Pt reports that lumbar roll does not work usually. Pt reports feeling good in prone/prone on elbows.   PAIN:    Pain Intensity: Present: 0/10, Best: 0/10, Worst: 10/10 Pain location: R buttock and down to R thigh/distomedial R thigh; intermittent referral to L side of back Pain Quality: Sharp pain in back, tingling into thigh region  Radiating: Yes , radiating to buttock and R thigh Numbness/Tingling: Yes; R anterior thigh  Focal Weakness: No Aggravating factors: prolonged walking, getting out of bed first thing, prolonged standing/static standing  Relieving  factors: on the move, prone/prone on elbows 24-hour pain behavior: worse in AM, bad in evening; better with movement in middle of day How long can you stand: No more than a couple of minutes History of prior back injury, pain, surgery, or therapy: Yes; one injury in 90s that resolved without treatment   Imaging: Yes , MRI 08/26/22  IMPRESSION: 1.  Moderate spinal stenosis at L3-L4 with  bilateral foraminal disc protrusions causing severe right neural foraminal stenosis. Modic type I endplate changes at this level. 2.  6 x 8 mm synovial cyst on the left at L5-S1 abutting the traversing S1 nerve root. 3.  Right facet capsular distention from L2-L3 to L4-L5. Increase T2 signal in the right L3 and L4 pedicles may be reactive to acute stress related changes.    Red flags: Negative for bowel/bladder changes, saddle paresthesia, personal history of cancer, h/o spinal tumors, h/o compression fx, h/o abdominal aneurysm, abdominal pain, chills/fever, night sweats, nausea, vomiting, unrelenting pain, first onset of insidious LBP <20 y/o  PRECAUTIONS: None  WEIGHT BEARING RESTRICTIONS: No  FALLS: Has patient fallen in last 6 months? 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   Hobbies: Walking exercise, elliptical exercise routine  Patient Goals: Able to complete walking exercise pain-free; able to stand at public venues/publuc outings    OBJECTIVE:  GAIT: Distance walked: 40 ft Assistive device utilized: None Level of assistance: Complete Independence Comments: Symmetrical weightbearing, upright guarded posture   Posture: Lumbar lordosis: WNL Iliac crest height: Equal bilaterally Lumbar lateral shift: to L side   AROM AROM (Normal range in degrees) AROM  11/15/22 AROM 01/04/23  Lumbar    Flexion (65) 75% (pull in hamstrings) WNL  Extension (30) 50% ("usually that hurts") WNL  Right lateral flexion (25) 50%* (leg pain) 75%  Left lateral flexion (25) 50% 100%  Right rotation (30) 75% WNL*  Left rotation (30) WNL WNL       Hip Right Left   Flexion (125)     Extension (15)     Abduction (40)     Adduction      Internal Rotation (45)     External Rotation (45)           (* = pain; Blank rows = not tested)  LE MMT: MMT (out of 5) Right  Left   Hip flexion 4+ 4+  Hip extension    Hip abduction    Hip adduction    Hip internal rotation    Hip external rotation    Knee flexion 5 5  Knee extension 5 5  Ankle dorsiflexion 4+ 4+  Ankle plantarflexion    Ankle inversion    Ankle eversion    (* = pain; Blank rows = not tested)  Sensation Dec light touch R anterior thigh   Muscle Length Hamstrings: R: Positive L: Negative Ely (quadriceps): R: Not examined L: Not examined Thomas (hip flexors): R: Not examined L: Not examined   Palpation Location Right Left         Lumbar paraspinals 1 0  Quadratus Lumborum    Gluteus Maximus 1 0  Gluteus Medius    Deep hip external rotators    PSIS 1   Fortin's Area (SIJ) 0   Greater Trochanter 0   (Blank rows = not tested) Graded on 0-4 scale (0 =  no pain, 1 = pain, 2 = pain with wincing/grimacing/flinching, 3 = pain with withdrawal, 4 = unwilling to allow palpation)   Passive Accessory Intervertebral Motion 11/17/22: Hypomobility with CPA L4-S1 and T7-T12. Pain prior to restriction at lower lumbar segments.    Special Tests Lumbar Radiculopathy and Discogenic: Slump (SN 83, -LR 0.32): R: Positive for L-sided back pain;  L: Positive for L-sided back pain SLR (SN 92, -LR 0.29): R: Negative L:  Negative  Facet Joint: Extension-Rotation (SN 100, -LR 0.0): R: Positive L: Positive  Lumbar Foraminal Stenosis: Lumbar quadrant (SN 70): R: Positive L: Positive  -No symptom reduction with hooklying general manual lumbar traction    TODAY'S TREATMENT: DATE: 01/04/2023    SUBJECTIVE STATEMENT:   Patient reports her back doesn't hurt as often. When she has RLE pain, she feels that it is burning quality. She is compliant with HEP. She reports doing well with spin bike. Pt reports rapid walking is better versus slow. Patient reports 5/10 pain at the moment. Patient reports doing generally well with  shopping - sometimes having to push shopping cart. Pt reports completing ample shopping Saturday at multiple stores with severe R leg pain. She reports doing okay with normal activity Sunday and work today. Patient reports doing well with self-traction techniques discussed at home - she feels that home exercises do help. 85% global rating of function.      Therapeutic Exercise - for improved soft tissue flexibility and extensibility as needed for ROM, improved strength as needed to improve performance of CKC activities/functional movements   *GOAL UPDATE PERFORMED  Quadruped sidebend, alternating R/L; 1x10 alt  Posterior pelvic tilt to bridge, with Silver physioball; 1x10  -for HEP review  -verbal and tactile cueing for PPT prior to lifting hips  Swiss ball lower trunk rotations; with Silver physioball; 1x10 alternating R/L  Up-up-down-down; supine march variation; reviewed  Dying bug, supine; with maintenance of neutral spine/avoidance of hyperlordosis; 1x10 alternating  -for HEP update/review   PATIENT EDUCATION: We discussed current progress with PT, encouragement to progress with flexion-bias and posterior chain strengthening drills. HEP updated and new handout provided.    *not today* [employer] single-limb leg press; Level 22; 2x10 Swiss ball posterior pelvic tilt, 1x10 Swiss ball seated march; 2x10 Lower trunk rotation; x10 alternating R/L Supine sciatic nerve flossing; reviewed for technique Supine piriformis stretch; 2 x 30 sec on each side  -for HEP update Repeated extension in lying; 2x10     Manual Therapy - for symptom modulation, soft tissue sensitivity and mobility, joint mobility, ROM    Bilateral LE traction with pt in hooklying for nerve root decompression, with Mulligan belt, supine; 10 sec intermittent holds; x 8 minutes   *next visit* DTM/TPR along R gluteus medius; x 8 minutes   *not today* Unilateral R long-leg distraction for symptom  modulation/nerve root decompression; x 3 minutes STM R>L lumbar erector spine L3-S1 and R gluteus maximus/medius; x 10 minutes     PATIENT EDUCATION:  Education details: see above for patient education details Person educated: Patient Education method: Explanation, Demonstration, and Handouts Education comprehension: verbalized understanding and returned demonstration   HOME EXERCISE PROGRAM:  Access Code: [email or web address] URL: [email or web address] Date: 01/04/2023 Prepared by: Consuela Mimes  Exercises - Supine Double Knee to Chest  - 5-6 x daily - 7 x weekly - 1 sets - 10 reps - 1sec hold - Hooklying Lumbar Traction  - 7 x weekly - 10 reps - 5-10sec hold - Supine 90/90 Sciatic Nerve  Glide with Knee Flexion/Extension  - 2 x daily - 7 x weekly - 2 sets - 10 reps - 1-2sec hold - Supine Piriformis Stretch with Foot on Ground  - 2 x daily - 7 x weekly - 3 sets - 30sec hold - Supine Bridge  - 1 x daily - 3 x weekly - 2 sets - 10 reps - Supine March  - 1 x daily - 3 x weekly - 2 sets - 10 reps - Swiss Ball March  - 1 x daily - 3 x weekly - 2 sets - 10 reps - Bridge with Heels on [employer]  - 2 x daily - 3 x weekly - 2 sets - 10 reps - Supine Lower Trunk Rotation with Swiss Ball  - 1 x daily - 3 x weekly - 2 sets - 10 reps - Supine Dead Bug with Leg Extension  - 1 x daily - 3 x weekly - 2 sets - 10 reps   ASSESSMENT:  CLINICAL IMPRESSION: In spite of ongoing challenges related to RLE pain and activity limitations with prolonged standing and walking, patient does report benefit of PT to date and she feels that intervention to date and home exercises have been helpful. She finds that ambulating at faster pace/faster cadence does help mitigate pain. Pt is also sitting while completing her work as able, but she does have to stand and walk with ambulatory patients completing gait and balance training. Patient has met her FOTO goal and demonstrates markedly improved thoracolumbar  AROM; however, NPRS at worst has change minimally and has not improved to MCID. 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 Pt has made fair progress to date and will benefit from tapered PT visits to ensure she is self-managing well without regression in condition. Pt will continue to benefit from skilled PT services to address deficits and improve function.    OBJECTIVE IMPAIRMENTS: decreased mobility, difficulty walking, decreased ROM, decreased strength, impaired flexibility, and pain.   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   REHAB POTENTIAL: Good  CLINICAL DECISION MAKING: Evolving/moderate complexity  EVALUATION COMPLEXITY: Moderate   GOALS: Goals reviewed with patient? Yes  SHORT TERM GOALS: Target date: 12/06/2022  Pt will be independent with HEP in order to improve strength and decrease back pain to improve pain-free function at home and work. Baseline: 11/15/22: Baseline HEP initiated.    01/04/23: Pt is compliant with HEP, exception of pt not adding new strengthening drills yet.  Goal status: ON-GOING   LONG TERM GOALS: Target date: 12/30/2022  Pt will increase FOTO to at least 61 to demonstrate significant improvement in function at home and work related to back pain  Baseline: 11/15/22: 46.      01/04/23: 65/61 Goal status: ACHIEVED  2.  By 4 weeks, will decrease worst back pain by at least 2 points on the NPRS in order to demonstrate clinically  significant reduction in back pain. Baseline: 11/15/22: 10/10 pain at worst.       01/04/23: 9/10 pain at worst.  Goal status: NOT MET   3.  Pt will complete standing/ambulatory  activity up to half of her work day without exacerbation of back pain and NPRS < 2/10 as needed for community-level mobility and completion of work duties Baseline: 11/15/22: Difficulty standing more than a couple of minutes     01/04/23: Pt reports tolerable pain on some days when she is busy; pt reports doing well sitting with patient. Difficulty with prolonged standing for patients working on ambulation and balance.  Goal status: ON-GOING   4.  Patient will have full thoracolumbar AROM without reproduction of pain as needed for reaching items on ground, household chores, bending. Baseline: 11/15/22: Motion loss into flexion, extension, R/L lateral flexion, R rotation.    01/04/23: Full motion in all directions with exception of R lateral flexion, pain with R rotation without motion loss. Goal status: PARTIAL PROGRESS/MOSTLY MET  5.  By completion of POC, will decrease worst back pain to no more than 2-3/10 on the NPRS in order to demonstrate clinically significant reduction in back pain and improved tolerance of work duties and community-level functional mobility. Baseline: 11/15/22: 10/10 pain at worst    01/04/23: 9/10 pain at worst Goal status: NOT MET    PLAN: PT FREQUENCY: 1x/week to once every other week  PT DURATION: 4-6 weeks  PLANNED INTERVENTIONS: Therapeutic exercises, Therapeutic activity, Neuromuscular re-education, Balance training, Gait training, Patient/Family education, Self Care, Joint mobilization, Joint manipulation, Vestibular training, Canalith repositioning, Orthotic/Fit training, DME instructions, Dry Needling, Electrical stimulation, Spinal manipulation, Spinal mobilization, Cryotherapy, Moist heat, Taping, Traction, Ultrasound, Ionotophoresis 4mg /ml Dexamethasone, Manual therapy, and  Re-evaluation.  PLAN FOR NEXT SESSION: Manual techniques for symptom modulation prn, bilateral/unilateral distraction. Progress with flexion-bias exercise and use of repeated flexion in lying as primary movement.     Consuela Mimes, PT, DPT #[id number]  Gertie Exon, PT 01/05/2023, 1:57 PM

## 2023-01-05 ENCOUNTER — Encounter: Payer: Self-pay | Admitting: Physical Therapy

## 2023-01-06 ENCOUNTER — Ambulatory Visit: Payer: BC Managed Care – PPO | Admitting: Physical Therapy

## 2023-01-11 ENCOUNTER — Encounter: Payer: BC Managed Care – PPO | Admitting: Physical Therapy

## 2023-01-13 ENCOUNTER — Encounter: Payer: BC Managed Care – PPO | Admitting: Physical Therapy

## 2023-01-27 ENCOUNTER — Encounter: Payer: Self-pay | Admitting: Physical Therapy

## 2023-02-09 ENCOUNTER — Ambulatory Visit: Payer: BC Managed Care – PPO | Attending: Physical Medicine & Rehabilitation | Admitting: Physical Therapy

## 2023-02-09 DIAGNOSIS — M5459 Other low back pain: Secondary | ICD-10-CM | POA: Insufficient documentation

## 2023-02-09 DIAGNOSIS — R262 Difficulty in walking, not elsewhere classified: Secondary | ICD-10-CM | POA: Insufficient documentation

## 2023-02-09 DIAGNOSIS — M5416 Radiculopathy, lumbar region: Secondary | ICD-10-CM | POA: Diagnosis present

## 2023-02-09 NOTE — Therapy (Signed)
 OUTPATIENT PHYSICAL THERAPY TREATMENT/GOAL UPDATE AND DISCHARGE SUMMARY    Patient Name: Beth Krause MRN: [id number] DOB:23-Mar-1960, 63 y.o., female Today's Date: 02/10/2023   END OF SESSION:  PT End of Session - 02/10/23 1438     Visit Number 11    Number of Visits 13    Date for PT Re-Evaluation 12/30/22    Authorization Type [employer], VL 60 combined PT/OT, plan year 05/03/22-05/02/23    PT Start Time 1722    PT Stop Time 1804    PT Time Calculation (min) 42 min    Activity Tolerance Patient tolerated treatment well    Behavior During Therapy [facility] for tasks assessed/performed               History reviewed. No pertinent past medical history. History reviewed. No pertinent surgical history. 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 Dodson 11/25/22  PERTINENT HISTORY: Pt is a 63 year old female referred for back and radicular pain secondary to disc herniation and stenosis. She is status post right L3 L2-3 and left L3-4 TFESI 10/11/2022 with temporary improvement. Pain is worse with standing and walking and better with sitting. Hx  of PT in May to June without much improvement. Pt works as PT - has worked for 35 years.   Pt reports symptoms started in beginning of May. Pt reports back pain episode in 1994 in with MRI showing slight disc bulge. Pt reports walking up to 4-5 mi per day usually and completing elliptical/spin bike. She reports insidious atraumatic onset of pain in back with pain into R buttock region and R leg. Pt started with [facility]. Pt had 2 sets of ESI at L3-4 and L4-5 without resolution of symptoms. 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 Patient has to perform MaxA transfers at work. She has difficulty with long-distance walking. Pt reports constant feeling of stiffness. Pt reports no disturbed sleep due to her pain. No unexplained weight change. No benefit from Tramadol, muscle relaxers. Pt denies changes in bathroom habits.Pt reports continuous tingling along L3 distribution.  Pt reports that lumbar roll does not work usually. Pt reports feeling good in prone/prone on elbows.   PAIN:    Pain Intensity: Present: 0/10, Best: 0/10, Worst: 10/10 Pain location: R buttock and down to R thigh/distomedial R thigh; intermittent referral to L side of back Pain Quality: Sharp pain in back, tingling into thigh region  Radiating: Yes , radiating to buttock and R thigh Numbness/Tingling: Yes; R anterior thigh  Focal Weakness: No Aggravating factors: prolonged walking, getting out of bed first thing, prolonged standing/static standing  Relieving factors: on the move, prone/prone on elbows 24-hour pain behavior: worse in  AM, bad in evening; better with movement in middle of day How long can you stand: No more than a couple of minutes History of prior back injury, pain, surgery, or therapy: Yes; one injury in 90s that resolved without treatment   Imaging: Yes , MRI 08/26/22  IMPRESSION: 1.  Moderate spinal stenosis at L3-L4 with bilateral foraminal disc protrusions causing severe right neural  foraminal stenosis. Modic type I endplate changes at this level. 2.  6 x 8 mm synovial cyst on the left at L5-S1 abutting the traversing S1 nerve root. 3.  Right facet capsular distention from L2-L3 to L4-L5. Increase T2 signal in the right L3 and L4 pedicles may be reactive to acute stress related changes.    Red flags: Negative for bowel/bladder changes, saddle paresthesia, personal history of cancer, h/o spinal tumors, h/o compression fx, h/o abdominal aneurysm, abdominal pain, chills/fever, night sweats, nausea, vomiting, unrelenting pain, first onset of insidious LBP <20 y/o  PRECAUTIONS: None  WEIGHT BEARING RESTRICTIONS: No  FALLS: Has patient fallen in last 6 months? 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   Hobbies: Walking exercise, elliptical exercise routine  Patient Goals: Able to complete walking exercise pain-free; able to stand at public venues/publuc outings    OBJECTIVE:  GAIT: Distance walked: 40 ft Assistive device utilized: None Level of assistance: Complete Independence Comments: Symmetrical weightbearing, upright guarded posture   Posture: Lumbar lordosis: WNL Iliac crest height: Equal bilaterally Lumbar lateral shift: to L side   AROM AROM (Normal range in degrees) AROM  11/15/22 AROM 01/04/23 AROM 02/09/23  Lumbar     Flexion (65) 75% (pull in hamstrings) WNL WNL  Extension (30) 50% (usually that hurts) WNL WNL* (twinge in R buttock)  Right lateral flexion (25) 50%* (leg pain) 75% 75%*  Left lateral flexion (25) 50% 100% 75%*  Right rotation (30) 75% WNL* WNL  Left rotation (30) WNL WNL WNL        Hip Right Left    Flexion (125)      Extension (15)      Abduction (40)      Adduction       Internal Rotation (45)      External Rotation  (45)            (* = pain; Blank rows = not tested)  LE MMT: MMT (out of 5) Right  Left   Hip flexion 4+ 4+  Hip extension    Hip abduction    Hip adduction    Hip internal rotation    Hip external rotation    Knee flexion 5 5  Knee extension 5 5  Ankle dorsiflexion 4+ 4+  Ankle plantarflexion    Ankle inversion    Ankle eversion    (* = pain; Blank rows = not tested)  Sensation Dec light touch R anterior thigh   Muscle Length Hamstrings: R: Positive L: Negative Ely (quadriceps): R: Not examined L: Not examined Thomas (hip flexors): R: Not examined L: Not examined   Palpation Location Right Left         Lumbar paraspinals 1 0  Quadratus Lumborum    Gluteus Maximus 1 0  Gluteus Medius    Deep hip external rotators    PSIS 1   Fortin's Area (SIJ) 0   Greater Trochanter 0   (  Blank rows = not tested) Graded on 0-4 scale (0 = no pain, 1 = pain, 2 = pain with wincing/grimacing/flinching, 3 = pain with withdrawal, 4 = unwilling to allow palpation)   Passive Accessory Intervertebral Motion 11/17/22: Hypomobility with CPA L4-S1 and T7-T12. Pain prior to restriction at lower lumbar segments.    Special Tests Lumbar Radiculopathy and Discogenic: Slump (SN 83, -LR 0.32): R: Positive for L-sided back pain;  L: Positive for L-sided back pain SLR (SN 92, -LR 0.29): R: Negative L:  Negative  Facet Joint: Extension-Rotation (SN 100, -LR 0.0): R: Positive L: Positive  Lumbar Foraminal Stenosis: Lumbar quadrant (SN 70): R: Positive L: Positive  -No symptom reduction with hooklying general manual lumbar traction    TODAY'S TREATMENT: DATE: 02/10/2023   SUBJECTIVE STATEMENT:   Patient reports being at plateau at the time with her progress. She reports some days being better than others. Patient reports limited volume of walking is tolerated.  Patient reports tolerating walking up to 5 minutes at a time before her symptoms get more flared up and she pushes through symptoms  at that time. Pt has to complete food prep and some kitchen work in sitting position; difficulty with static standing tasks in kitchen. Patient reports completing 45 minutes of spin bike without difficulty. Pt reports burning pain affecting R gluteal region and down RLE to her knee that is intermittently worsened with extension-biased postures and activities. Patient reports no pain in sitting at arrival with subjective intake. Pt reports doing well managing her workday today. Pt is following up with Dr. Clois next week.     Therapeutic Exercise - for improved soft tissue flexibility and extensibility as needed for ROM, improved strength as needed to improve performance of CKC activities/functional movements   *GOAL UPDATE PERFORMED  Up-up-down-down; supine march variation; 1x10  Dying bug, supine; with maintenance of neutral spine/avoidance of hyperlordosis; 1x10 alternating  -for HEP update/review   PATIENT EDUCATION: We reviewed established HEP and discussed continuing with progression of exercise volume/intensity as tolerated with changing rep scheme as able with pt having background in PT. We discussed graded exposure to walking with pt using increased cadence as needed to improve tolerance and progressing duration at 1-2 week intervals using pain as guide (discussed stoplight system to use pain for guiding activity). We discussed strategies for self-traction and self-TPR for R deep gluteal region.     *not today* Swiss ball lower trunk rotations; with Silver physioball; 1x10 alternating R/L Posterior pelvic tilt to bridge, with Silver physioball; 1x10  -for HEP review  -verbal and tactile cueing for PPT prior to lifting hips Quadruped sidebend, alternating R/L; 1x10 alt [employer] single-limb leg press; Level 22; 2x10 Swiss ball posterior pelvic tilt, 1x10 Swiss ball seated march; 2x10 Lower trunk rotation; x10 alternating R/L Supine sciatic nerve flossing; reviewed for  technique Supine piriformis stretch; 2 x 30 sec on each side  -for HEP update Repeated extension in lying; 2x10     Manual Therapy - for symptom modulation, soft tissue sensitivity and mobility, joint mobility, ROM   DTM/TPR along R gluteus medius/deep hip external rotators; x 6 minutes   *not today* Bilateral LE traction with pt in hooklying for nerve root decompression, with Mulligan belt, supine; 10 sec intermittent holds; x 8 minutes Unilateral R long-leg distraction for symptom modulation/nerve root decompression; x 3 minutes STM R>L lumbar erector spine L3-S1 and R gluteus maximus/medius; x 10 minutes     PATIENT EDUCATION:  Education details: see above  for patient education details Person educated: Patient Education method: Explanation, Demonstration, and Handouts Education comprehension: verbalized understanding and returned demonstration   HOME EXERCISE PROGRAM:  Access Code: [email or web address] URL: [email or web address] Date: 01/04/2023 Prepared by: Venetia Endo  Exercises - Supine Double Knee to Chest  - 5-6 x daily - 7 x weekly - 1 sets - 10 reps - 1sec hold - Hooklying Lumbar Traction  - 7 x weekly - 10 reps - 5-10sec hold - Supine 90/90 Sciatic Nerve Glide with Knee Flexion/Extension  - 2 x daily - 7 x weekly - 2 sets - 10 reps - 1-2sec hold - Supine Piriformis Stretch with Foot on Ground  - 2 x daily - 7 x weekly - 3 sets - 30sec hold - Supine Bridge  - 1 x daily - 3 x weekly - 2 sets - 10 reps - Supine March  - 1 x daily - 3 x weekly - 2 sets - 10 reps - Swiss Ball March  - 1 x daily - 3 x weekly - 2 sets - 10 reps - Bridge with Heels on [employer]  - 2 x daily - 3 x weekly - 2 sets - 10 reps - Supine Lower Trunk Rotation with Swiss Ball  - 1 x daily - 3 x weekly - 2 sets - 10 reps - Supine Dead Bug with Leg Extension  - 1 x daily - 3 x weekly - 2 sets - 10 reps   ASSESSMENT:  CLINICAL IMPRESSION: Pt has made fair progress to date, but there is  notable plateau in progression of activity and standing/walking especially. Patient fortunately was able to meet her FOTO score and make apparent progress with thoracolumbar AROM. NPRS at worst is still fairly high, though she does have some days with pain in 4-5/10 range. Low back and R lower quarter pain is still significantly limiting for patient and impacts here ability to complete prolonged standing tasks as well as ambulatory/weightbearing tasks at work as PT. Pt will be following up with Dr. Clois next week to further discuss medical management/surgical options. Pt has advanced HEP established at this time and we have discussed graded exposure to walking to improve her tolerance and capability of prolonged walking. 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 Pt has made fair progress to date and is appropriate for transition to advanced HEP at this time. We will discharge following today's goal update.     OBJECTIVE IMPAIRMENTS: decreased mobility, difficulty walking, decreased ROM, decreased strength, impaired flexibility, and pain.   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   REHAB POTENTIAL: Good  CLINICAL DECISION MAKING: Evolving/moderate complexity  EVALUATION COMPLEXITY: Moderate   GOALS: Goals reviewed with patient? Yes  SHORT TERM GOALS: Target date: 12/06/2022  Pt will be independent with HEP in order to improve strength and decrease back pain to improve  pain-free function at home and work. Baseline: 11/15/22: Baseline HEP initiated.    01/04/23: Pt is compliant with HEP, exception of pt not adding new strengthening drills yet.    02/09/23: Pt is completing stretching/repeated movement daily; inconsistency with new strengthening  drils Goal status: PARTIALLY MET    LONG TERM GOALS: Target date: 12/30/2022  Pt will increase FOTO to at least 61 to demonstrate significant improvement in function at home and work related to back pain  Baseline: 11/15/22: 46.      01/04/23: 65/61 Goal status: ACHIEVED  2.  By 4 weeks, will decrease worst back pain by at least 2 points on the NPRS in order to demonstrate clinically significant reduction in back pain. Baseline: 11/15/22: 10/10 pain at worst.       01/04/23: 9/10 pain at worst.  02/09/23: 8-9/10 at worst with pushing standing activities.  Goal status: NOT MET   3.  Pt will complete standing/ambulatory activity up to half of her work day without exacerbation of back pain and NPRS < 2/10 as needed for community-level mobility and completion of work duties Baseline: 11/15/22: Difficulty standing more than a couple of minutes     01/04/23: Pt reports tolerable pain on some days when she is busy; pt reports doing well sitting with patient. Difficulty with prolonged standing for patients working on ambulation and balance.    02/09/23: Pt reports generally tolerating morning schedule and documenting during lunch break in sitting well; more pain at 8-9 AM when first starting out.  Goal status: PARTIALLY MET   4.  Patient will have full thoracolumbar AROM without reproduction of pain as needed for reaching items on ground, household chores, bending. Baseline: 11/15/22: Motion loss into flexion, extension, R/L lateral flexion, R rotation.    01/04/23: Full motion in all directions with exception of R lateral flexion, pain with R rotation without motion loss.   02/09/23:  Motion loss with extension and R/L lateral flexion;  pain with extension and R>L lateral flexion.  Goal status: PARTIAL PROGRESS  5.  By completion of POC, will decrease worst back pain to no more than 2-3/10 on the NPRS in order to demonstrate clinically significant reduction in back pain and improved tolerance of work duties and community-level functional mobility. Baseline: 11/15/22: 10/10 pain at worst    01/04/23: 9/10 pain at worst     02/09/23: 8-9/10 at worst.  Goal status: NOT MET    PLAN: PT FREQUENCY: -  PT DURATION: -  PLANNED INTERVENTIONS: Therapeutic exercises, Therapeutic activity, Neuromuscular re-education, Balance training, Gait training, Patient/Family education, Self Care, Joint mobilization, Joint manipulation, Vestibular training, Canalith repositioning, Orthotic/Fit training, DME instructions, Dry Needling, Electrical stimulation, Spinal manipulation, Spinal mobilization, Cryotherapy, Moist heat, Taping, Traction, Ultrasound, Ionotophoresis 4mg /ml Dexamethasone , Manual therapy, and Re-evaluation.  PLAN FOR NEXT SESSION: Pt to continue with advanced home program and progress intensity and set/rep scheme as tolerated. Pt to continue with graded exposure to walking with change in duration as tolerated using pain as guide. Pt will be following up with Dr. Clois next week to discuss her surgical/non-surgical options.     Venetia Endo, PT, DPT #[id number]  Venetia ONEIDA Endo, PT 02/10/2023, 2:39 PM

## 2023-02-10 ENCOUNTER — Encounter: Payer: Self-pay | Admitting: Physical Therapy

## 2023-02-16 ENCOUNTER — Inpatient Hospital Stay
Admission: RE | Admit: 2023-02-16 | Discharge: 2023-02-16 | Disposition: A | Discharge status: Home / Self Care | Payer: Self-pay | Source: Ambulatory Visit | Attending: Neurosurgery | Admitting: Neurosurgery

## 2023-02-16 ENCOUNTER — Other Ambulatory Visit: Payer: Self-pay | Admitting: Family Medicine

## 2023-02-16 DIAGNOSIS — Z049 Encounter for examination and observation for unspecified reason: Secondary | ICD-10-CM

## 2023-02-16 NOTE — Progress Notes (Signed)
Referring Physician:  Elijah Birk, MD [address] [place],  [place] [address]  Primary Physician:  [facility]  History of Present Illness: 02/17/2023 Beth Krause is here today with a chief complaint of worsening back discomfort and leg discomfort over the past 9 months or so.  She has noticed a change in her posture as well.  She gets pain into her right anterior thigh particular when she stands or walks.  She is now very limited in how far she can walk.  She previously walked 5 miles a day.  She can no longer walk unaided for any distance.  Sitting makes her pain better.  Her pain is constant.  She denies any weakness.  Of note, she is a physical therapist.  She has been trying extensive physical therapy.  Bowel/Bladder Dysfunction: none  Conservative measures:  Physical therapy: Has participated in Pt at [facility] for 2 months  Multimodal medical therapy including regular antiinflammatories: Medrol Dosepak, Methocarbamol, Tramadol, Prednisone, Gabapentin, Celebrex  Injections: Yes 10/11/2022 Left L3-4 and Right L2-3 TF ESI 09/16/22: Right L3-4 TF ESI  Past Surgery: none  Beth Krause has no symptoms of cervical myelopathy.  The symptoms are causing a significant impact on the patient's life.   I have utilized the care everywhere function in epic to review the outside records available from external health systems.  Review of Systems:  A 10 point review of systems is negative, except for the pertinent positives and negatives detailed in the HPI.  Past Medical History: No past medical history on file.  Past Surgical History: No past surgical history on file.  Allergies: Allergies as of 02/17/2023 - Review Complete 02/17/2023  Allergen Reaction Noted   Influenza vaccines Other (See Comments) 12/10/2021    Medications:  Current Outpatient Medications:    baclofen (LIORESAL) 10 MG tablet, Take 10 mg by mouth 3 (three) times daily., Disp: , Rfl:    BREO  ELLIPTA 200-25 MCG/INH AEPB, Inhale 1 puff into the lungs daily., Disp: , Rfl:    cetirizine (ZYRTEC) 10 MG tablet, Take by mouth., Disp: , Rfl:    Cholecalciferol (VITAMIN D3) 1.25 MG (50000 UT) TABS, Take by mouth., Disp: , Rfl:    COMBIVENT RESPIMAT 20-100 MCG/ACT AERS respimat, , Disp: , Rfl:    gabapentin (NEURONTIN) 300 MG capsule, Take 300 mg by mouth 3 (three) times daily., Disp: , Rfl:    hyoscyamine (LEVSIN) 0.125 MG tablet, Take by mouth every 4 (four) hours as needed., Disp: , Rfl:    levothyroxine (SYNTHROID, LEVOTHROID) 50 MCG tablet, , Disp: , Rfl:    lisinopril (PRINIVIL,ZESTRIL) 10 MG tablet, , Disp: , Rfl:    NONFORMULARY OR COMPOUNDED ITEM, See pharmacy note, Disp: 120 each, Rfl: 2   omeprazole (PRILOSEC) 40 MG capsule, Take by mouth., Disp: , Rfl:    PROAIR HFA 108 (90 BASE) MCG/ACT inhaler, , Disp: , Rfl:   Current Facility-Administered Medications:    betamethasone acetate-betamethasone sodium phosphate (CELESTONE) injection 3 mg, 3 mg, Intra-articular, Once, Evans, Larena Glassman, DPM  Social History: Social History   Tobacco Use   Smoking status: Never   Smokeless tobacco: Never  Substance Use Topics   Alcohol use: No   Drug use: Never    Family Medical History: Family History  Problem Relation Age of Onset   Breast cancer Neg Hx     Physical Examination: Vitals:   02/17/23 1455  BP: 134/84    General: Patient is in no  apparent distress. Attention to examination is appropriate.  Neck:   Supple.  Full range of motion.  Respiratory: Patient is breathing without any difficulty.   NEUROLOGICAL:     Awake, alert, oriented to person, place, and time.  Speech is clear and fluent.   Cranial Nerves: Pupils equal round and reactive to light.  Facial tone is symmetric.  Facial sensation is symmetric. Shoulder shrug is symmetric. Tongue protrusion is midline.  There is no pronator drift.  Strength: Side Biceps Triceps Deltoid Interossei Grip Wrist Ext. Wrist  Flex.  R 5 5 5 5 5 5 5   L 5 5 5 5 5 5 5    Side Iliopsoas Quads Hamstring PF DF EHL  R 5 5 5 5 5 5   L 5 5 5 5 5 5    Reflexes are 1+ and symmetric at the biceps, triceps, brachioradialis, patella and achilles.   Hoffman's is absent.   Bilateral upper and lower extremity sensation is intact to light touch.    No evidence of dysmetria noted.  Gait is normal.  Posture shows signs of positive sagittal balance.   Medical Decision Making  Imaging: MRI L spine 08/25/2022  IMPRESSION:  1. Moderate spinal stenosis at L3-L4 with bilateral foraminal disc  protrusions causing severe right neural foraminal stenosis. Modic type I  endplate changes at this level.  2.  6 x 8 mm synovial cyst on the left at L5-S1 abutting the traversing S1  nerve root.  3.  Right facet capsular distention from L2-L3 to L4-L5. Increase T2 signal  in the right L3 and L4 pedicles may be reactive to acute stress related  changes.   Electronically Signed by:  Rutha Bouchard, MD, [facility]  Electronically Signed on:  08/26/2022 6:18 PM   Her lumbar spine x-rays from 2024 show 25 degree lumbar lordosis with a lumbar kyphosis between L1 and L4.   I have personally reviewed the images and agree with the above interpretation.  Assessment and Plan: Beth Krause is a pleasant 63 y.o. female with acquired scoliosis due to advanced degenerative changes of the lumbar spine.  She has spondylolisthesis.  She appears to have possible positive sagittal balance, but has not had appropriate scoliosis x-rays to evaluate this.  She is having back pain with right-sided sciatica.  She has tried and failed conservative management.  At this point, surgical intervention is a consideration.  I think she will likely need a scoliosis surgery, but may be a candidate for L1-4 lateral interbody fusion with percutaneous fixation to directly address the area of her lumbar kyphosis versus a more traditional scoliosis surgery.  I will obtain  DEXA study, thoracic MRI, scoliosis x-rays, and lumbar spine x-rays with flexion-extension views so that we can make a final plan.  I spent a total of 30 minutes in this patient's care today. This time was spent reviewing pertinent records including imaging studies, obtaining and confirming history, performing a directed evaluation, formulating and discussing my recommendations, and documenting the visit within the medical record.     Thank you for involving me in the care of this patient.      Mcgregor Tinnon K. Myer Haff MD, [facility]

## 2023-02-17 ENCOUNTER — Ambulatory Visit: Payer: BC Managed Care – PPO | Admitting: Neurosurgery

## 2023-02-17 VITALS — BP 134/84 | Ht 63.0 in | Wt 207.0 lb

## 2023-02-17 DIAGNOSIS — M5441 Lumbago with sciatica, right side: Secondary | ICD-10-CM | POA: Diagnosis not present

## 2023-02-17 DIAGNOSIS — M4316 Spondylolisthesis, lumbar region: Secondary | ICD-10-CM | POA: Diagnosis not present

## 2023-02-17 DIAGNOSIS — E2839 Other primary ovarian failure: Secondary | ICD-10-CM

## 2023-02-17 DIAGNOSIS — G8929 Other chronic pain: Secondary | ICD-10-CM | POA: Diagnosis not present

## 2023-02-17 DIAGNOSIS — M419 Scoliosis, unspecified: Secondary | ICD-10-CM

## 2023-02-17 DIAGNOSIS — M5416 Radiculopathy, lumbar region: Secondary | ICD-10-CM

## 2023-02-23 ENCOUNTER — Ambulatory Visit
Admission: RE | Admit: 2023-02-23 | Discharge: 2023-02-23 | Disposition: A | Discharge status: Home / Self Care | Payer: BC Managed Care – PPO | Source: Ambulatory Visit | Attending: Neurosurgery | Admitting: Neurosurgery

## 2023-02-23 ENCOUNTER — Telehealth: Payer: Self-pay

## 2023-02-23 ENCOUNTER — Other Ambulatory Visit: Payer: Self-pay | Admitting: Neurosurgery

## 2023-02-23 DIAGNOSIS — M4316 Spondylolisthesis, lumbar region: Secondary | ICD-10-CM | POA: Insufficient documentation

## 2023-02-23 DIAGNOSIS — G8929 Other chronic pain: Secondary | ICD-10-CM

## 2023-02-23 DIAGNOSIS — M419 Scoliosis, unspecified: Secondary | ICD-10-CM

## 2023-02-23 DIAGNOSIS — E2839 Other primary ovarian failure: Secondary | ICD-10-CM

## 2023-02-23 DIAGNOSIS — M5416 Radiculopathy, lumbar region: Secondary | ICD-10-CM | POA: Insufficient documentation

## 2023-02-23 NOTE — Telephone Encounter (Signed)
I spoke with the patient and relayed Dr Lucienne Capers message. She is agreeable to going to [facility] to have both xrays repeated. I informed her to contact our office if she receives a bill for duplicate xrays. I also spoke with our site manager Lanora Manis about this.

## 2023-02-23 NOTE — Telephone Encounter (Addendum)
Beth Krause presented to [facility] for scoliosis and lumbar xrays today. The appropriate scoliosis films were not completed (only 1 view), and the lumbar xrays are inadequate quality. The xray orders were canceled/changed by Reynolds Bowl at [facility] without consulting with Dr Myer Haff.  Per Dr Myer Haff: "Please call the patient and apologize, but that the x-rays at [facility] are not adequate. She will need to come back and repeat both deflection extension and the scoliosis x-rays at the hospital. I do not know why they are so bad. I will see if we can get them no charged."  I left a message for the patient requesting she return my call to discuss her xrays. New xrays have been ordered to be done at [facility].

## 2023-02-28 ENCOUNTER — Ambulatory Visit
Admission: RE | Admit: 2023-02-28 | Discharge: 2023-02-28 | Disposition: A | Discharge status: Home / Self Care | Payer: BC Managed Care – PPO | Source: Ambulatory Visit | Attending: Neurosurgery | Admitting: Neurosurgery

## 2023-02-28 DIAGNOSIS — M5416 Radiculopathy, lumbar region: Secondary | ICD-10-CM

## 2023-02-28 DIAGNOSIS — M4316 Spondylolisthesis, lumbar region: Secondary | ICD-10-CM

## 2023-02-28 DIAGNOSIS — M419 Scoliosis, unspecified: Secondary | ICD-10-CM

## 2023-03-01 ENCOUNTER — Ambulatory Visit (INDEPENDENT_AMBULATORY_CARE_PROVIDER_SITE_OTHER): Payer: BC Managed Care – PPO

## 2023-03-01 ENCOUNTER — Ambulatory Visit
Admission: EM | Admit: 2023-03-01 | Discharge: 2023-03-01 | Disposition: A | Discharge status: Home / Self Care | Payer: BC Managed Care – PPO

## 2023-03-01 ENCOUNTER — Encounter: Payer: Self-pay | Admitting: Emergency Medicine

## 2023-03-01 DIAGNOSIS — R058 Other specified cough: Secondary | ICD-10-CM

## 2023-03-01 DIAGNOSIS — J209 Acute bronchitis, unspecified: Secondary | ICD-10-CM

## 2023-03-01 DIAGNOSIS — J45901 Unspecified asthma with (acute) exacerbation: Secondary | ICD-10-CM | POA: Diagnosis not present

## 2023-03-01 DIAGNOSIS — J019 Acute sinusitis, unspecified: Secondary | ICD-10-CM | POA: Diagnosis not present

## 2023-03-01 HISTORY — DX: Unspecified asthma, uncomplicated: J45.909

## 2023-03-01 MED ORDER — PREDNISONE 20 MG PO TABS: ORAL_TABLET | ORAL | 0 refills | Status: DC

## 2023-03-01 MED ORDER — GUAIFENESIN-CODEINE 100-10 MG/5ML PO SOLN: ORAL | 0 refills | Status: DC

## 2023-03-01 MED ORDER — AMOXICILLIN-POT CLAVULANATE 875-125 MG PO TABS: 1.0000 | ORAL_TABLET | Freq: Two times a day (BID) | ORAL | 0 refills | Status: AC

## 2023-03-01 NOTE — Discharge Instructions (Signed)
-  X-ray does not show pneumonia. - You have bronchitis and no signs infection.  Lets try a new antibiotic, longer course of corticosteroid, continue DuoNebs 3 times a day and continue Breo inhaler.  I also sent a stronger cough medicine. - Hopefully your symptoms get better over the next 1 to 2 weeks.  If they do not or symptoms worsen please follow-up with your primary care provider.  Go to ED for any acute worsening of symptoms.

## 2023-03-01 NOTE — ED Triage Notes (Signed)
Pt presents with wheezing, barking cough, brown mucus x 5 days. Pt states about a month ago she was treated with doxycyline and 2 rounds of prednisone. She received care via telehealth.

## 2023-03-01 NOTE — ED Provider Notes (Signed)
[facility]    CSN: [id number] Arrival date & time: 03/01/23  1642      History   Chief Complaint Chief Complaint  Patient presents with   Cough   Shortness of Breath   Wheezing    HPI Beth Krause is a 63 y.o. female presenting for 1 month history of cough, congestion, wheezing and shortness of breath.  Patient reports having a telemedicine visit with PCP on 02/03/2023 and receiving doxycycline, 2 rounds of prednisone, and cough medicine.  Says symptoms got better for a little bit and then seem to come right back.  Reports nasal congestion and greenish nasal drainage over the past 1 week.  Also has had sinus pressure.  Medical history significant for asthma. Uses Breo inhaler and duoneb at home. No history of COPD.  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      Home Medications    Prior to Admission medications   Medication Sig Start Date End Date Taking? Authorizing Provider  amoxicillin-clavulanate (AUGMENTIN) 875-125 MG tablet Take 1 tablet by mouth every 12 (twelve) hours for 10 days. 03/01/23 03/11/23 Yes Eusebio Friendly B, PA-C  atorvastatin (LIPITOR) 20 MG tablet Take 20 mg by mouth daily. 02/14/23  Yes [provider]  BREO ELLIPTA 200-25 MCG/INH AEPB Inhale 1 puff into the lungs daily. 05/03/19  Yes [provider]  cetirizine (ZYRTEC) 10 MG tablet Take by mouth.   Yes [provider]  Cholecalciferol (VITAMIN D3) 1.25 MG (50000 UT) TABS Take by mouth.   Yes [provider]  COMBIVENT RESPIMAT 20-100 MCG/ACT AERS respimat  05/05/13  Yes [provider]  gabapentin (NEURONTIN) 300 MG capsule Take 300 mg by mouth 3 (three) times daily.   Yes [provider]  guaiFENesin-codeine 100-10 MG/5ML syrup Take 5 to 10 mL p.o. every 6 hours as needed cough 03/01/23  Yes Shirlee Latch, PA-C  hyoscyamine (LEVSIN) 0.125 MG tablet Take by mouth every 4 (four) hours as needed. 05/02/19  Yes [provider]  levothyroxine (SYNTHROID, LEVOTHROID) 50 MCG tablet  06/15/13  Yes [provider]  lisinopril (PRINIVIL,ZESTRIL) 10 MG tablet  06/15/13  Yes [provider]  omeprazole (PRILOSEC) 40 MG capsule Take by mouth.   Yes [provider]  predniSONE (DELTASONE) 20 MG tablet Take 5 tabs p.o. x 2 days, 4 tabs p.o. x 2 days, 3 tabs p.o. x 2 days, 2 tabs p.o. x 2 days, 1 tab p.o. x 2 days 03/01/23  Yes Gareth Morgan  [facility] HFA 108 (90 BASE) MCG/ACT inhaler  07/13/13  Yes [provider]  baclofen (LIORESAL) 10 MG tablet Take 10 mg by mouth 3 (three) times daily. 02/10/23 03/12/23  [provider]  NONFORMULARY OR COMPOUNDED ITEM See pharmacy note 06/14/19   Elinor Parkinson,  DPM    Family History Family History  Problem Relation Age of Onset   Breast cancer Neg Hx     Social History Social History   Tobacco Use   Smoking status: Never   Smokeless tobacco: Never  Vaping Use   Vaping status: Never Used  Substance Use Topics   Alcohol use: No   Drug use: Never     Allergies   Influenza vaccines   Review of Systems Review of Systems  Constitutional:  Positive for fatigue. Negative for chills, diaphoresis and fever.  HENT:  Positive for congestion, rhinorrhea, sinus pressure and sinus pain. Negative for ear pain  and sore throat.   Respiratory:  Positive for cough, shortness of breath and wheezing.   Gastrointestinal:  Negative for abdominal pain, nausea and vomiting.  Musculoskeletal:  Negative for arthralgias and myalgias.  Skin:  Negative for rash.  Neurological:  Negative for weakness and headaches.  Hematological:  Negative for adenopathy.     Physical Exam Triage Vital Signs ED Triage Vitals  Encounter Vitals Group     BP 03/01/23 1659 138/88     Systolic BP Percentile --      Diastolic BP Percentile --      Pulse Rate 03/01/23 1659 90     Resp 03/01/23 1659 18     Temp 03/01/23 1659 98.4 F (36.9 C)     Temp Source 03/01/23 1659 Oral     SpO2 03/01/23 1659 95 %     Weight --      Height --      Head Circumference --      Peak Flow --      Pain Score 03/01/23 1657 0     Pain Loc --      Pain Education --      Exclude from Growth Chart --    No data found.  Updated Vital Signs BP 138/88 (BP Location: Left Arm)   Pulse 90   Temp 98.4 F (36.9 C) (Oral)   Resp 18   SpO2 95%     Physical Exam Vitals and nursing note reviewed.  Constitutional:      General: She is not in acute distress.    Appearance: Normal appearance. She is ill-appearing. She is not toxic-appearing.  HENT:     Head: Normocephalic and atraumatic.     Nose: Congestion present.     Mouth/Throat:     Mouth: Mucous membranes are moist.     Pharynx: Oropharynx is clear.  Eyes:     General: No scleral icterus.       Right eye: No discharge.        Left eye: No discharge.     Conjunctiva/sclera: Conjunctivae normal.  Cardiovascular:     Rate and Rhythm: Normal rate and regular rhythm.     Heart sounds: Normal heart sounds.  Pulmonary:     Effort: Pulmonary effort is normal. No respiratory distress.     Breath sounds: Wheezing and rhonchi present.  Musculoskeletal:     Cervical back: Neck supple.  Skin:    General: Skin is dry.  Neurological:     General: No focal deficit present.     Mental  Status: She is alert. Mental status is at baseline.     Motor: No weakness.     Gait: Gait normal.  Psychiatric:        Mood and Affect: Mood normal.        Behavior: Behavior normal.  UC Treatments / Results  Labs (all labs ordered are listed, but only abnormal results are displayed) Labs Reviewed - No data to display  EKG   Radiology DG Chest 2 View Result Date: 03/01/2023 CLINICAL DATA:  Cough for 5 days with brown mucus. Productive cough. EXAM: CHEST - 2 VIEW COMPARISON:  Radiograph 07/23/2021 FINDINGS: Stable heart size and mediastinal contours. Chronic hyperinflation. Bronchial thickening has increased. No focal airspace disease. No pleural effusion or pneumothorax. No pulmonary edema. No acute osseous findings. IMPRESSION: Chronic hyperinflation. Increased bronchial thickening may be due to bronchitis or asthma. No focal airspace disease. Electronically Signed   By: Narda Rutherford M.D.   On: 03/01/2023 17:28    Procedures Procedures (including critical care time)  Medications Ordered in UC Medications - No data to display  Initial Impression / Assessment and Plan / UC Course  I have reviewed the triage vital signs and the nursing notes.  Pertinent labs & imaging results that were available during my care of the patient were reviewed by me and considered in my medical decision making (see chart for details).   63 year old female presents for 1 month history of cough, congestion, wheezing and shortness of breath.  Treated initially with doxycycline, prednisone and cough medicine.  Has been performing DuoNeb treatments and taking Breo.  Reports symptoms got better but then over the past week she has had increased sinus pressure and discolored nasal drainage.  Chest x-ray performed today shows no acute abnormality.  Consistent with bronchitis or asthma.  Reviewed results with patient.  Acute sinusitis/bronchitis and asthma exacerbation.  Will treat at this time with  Augmentin, Cheratussin, longer prednisone taper.  Advised Flonase, increasing rest fluids, nasal saline rinses.  Follow-up with PCP especially not getting better in the next couple weeks.  Acute illness with systemic symptoms.  Exacerbation of chronic underlying condition.   Final Clinical Impressions(s) / UC Diagnoses   Final diagnoses:  Productive cough  Acute sinusitis, recurrence not specified, unspecified location  Exacerbation of asthma, unspecified asthma severity, unspecified whether persistent  Acute bronchitis, unspecified organism     Discharge Instructions      -X-ray does not show pneumonia. - You have bronchitis and no signs infection.  Lets try a new antibiotic, longer course of corticosteroid, continue DuoNebs 3 times a day and continue Breo inhaler.  I also sent a stronger cough medicine. - Hopefully your symptoms get better over the next 1 to 2 weeks.  If they do not or symptoms worsen please follow-up with your primary care provider.  Go to ED for any acute worsening of symptoms.     ED Prescriptions     Medication Sig Dispense Auth. Provider   amoxicillin-clavulanate (AUGMENTIN) 875-125 MG tablet Take 1 tablet by mouth every 12 (twelve) hours for 10 days. 20 tablet Eusebio Friendly B, PA-C   guaiFENesin-codeine 100-10 MG/5ML syrup Take 5 to 10 mL p.o. every 6 hours as needed cough 120 mL Eusebio Friendly B, PA-C   predniSONE (DELTASONE) 20 MG tablet Take 5 tabs p.o. x 2 days, 4 tabs p.o. x 2 days, 3 tabs p.o. x 2 days, 2 tabs p.o. x 2 days, 1 tab p.o. x 2 days 30 tablet Shirlee Latch, PA-C      I have reviewed the PDMP during this encounter.   Shirlee Latch, PA-C 03/01/23 2020

## 2023-03-02 ENCOUNTER — Ambulatory Visit: Payer: BC Managed Care – PPO

## 2023-03-04 ENCOUNTER — Encounter: Payer: Self-pay | Admitting: Neurosurgery

## 2023-03-04 ENCOUNTER — Ambulatory Visit: Payer: BC Managed Care – PPO | Admitting: Neurosurgery

## 2023-03-04 VITALS — BP 128/82 | Ht 63.0 in | Wt 213.0 lb

## 2023-03-04 DIAGNOSIS — M438X9 Other specified deforming dorsopathies, site unspecified: Secondary | ICD-10-CM

## 2023-03-04 DIAGNOSIS — M5441 Lumbago with sciatica, right side: Secondary | ICD-10-CM | POA: Diagnosis not present

## 2023-03-04 DIAGNOSIS — M419 Scoliosis, unspecified: Secondary | ICD-10-CM

## 2023-03-04 DIAGNOSIS — M4316 Spondylolisthesis, lumbar region: Secondary | ICD-10-CM

## 2023-03-04 DIAGNOSIS — G8929 Other chronic pain: Secondary | ICD-10-CM

## 2023-03-04 NOTE — Progress Notes (Signed)
Referring Physician:  [facility] [address] [place],  [place] [address]  Primary Physician:  [facility]  History of Present Illness: 03/04/2023 Beth Krause presents today for discussion of results.  02/17/2023 Beth Krause is here today with a chief complaint of worsening back discomfort and leg discomfort over the past 9 months or so.  She has noticed a change in her posture as well.  She gets pain into her right anterior thigh particular when she stands or walks.  She is now very limited in how far she can walk.  She previously walked 5 miles a day.  She can no longer walk unaided for any distance.  Sitting makes her pain better.  Her pain is constant.  She denies any weakness.  Of note, she is a physical therapist.  She has been trying extensive physical therapy.  Bowel/Bladder Dysfunction: none  Conservative measures:  Physical therapy: Has participated in [facility] at [facility] for 2 months  Multimodal medical therapy including regular antiinflammatories: Medrol Dosepak, Methocarbamol, Tramadol, Prednisone, Gabapentin, Celebrex  Injections: Yes 10/11/2022 Left L3-4 and Right L2-3 TF ESI 09/16/22: Right L3-4 TF ESI  Past Surgery: none  Beth Krause has no symptoms of cervical myelopathy.  The symptoms are causing a significant impact on the patient's life.   I have utilized the care everywhere function in epic to review the outside records available from external health systems.  Review of Systems:  A 10 point review of systems is negative, except for the pertinent positives and negatives detailed in the HPI.  Past Medical History: Past Medical History:  Diagnosis Date   Asthma     Past Surgical History: Past Surgical History:  Procedure Laterality Date   CHOLECYSTECTOMY      Allergies: Allergies as of 03/04/2023 - Review Complete 03/04/2023  Allergen Reaction Noted   Influenza vaccines Other (See Comments) 12/10/2021    Medications:  Current Outpatient  Medications:    amoxicillin-clavulanate (AUGMENTIN) 875-125 MG tablet, Take 1 tablet by mouth every 12 (twelve) hours for 10 days., Disp: 20 tablet, Rfl: 0   atorvastatin (LIPITOR) 20 MG tablet, Take 20 mg by mouth daily., Disp: , Rfl:    baclofen (LIORESAL) 10 MG tablet, Take 10 mg by mouth 3 (three) times daily., Disp: , Rfl:    BREO ELLIPTA 200-25 MCG/INH AEPB, Inhale 1 puff into the lungs daily., Disp: , Rfl:    cetirizine (ZYRTEC) 10 MG tablet, Take by mouth., Disp: , Rfl:    Cholecalciferol (VITAMIN D3) 1.25 MG (50000 UT) TABS, Take by mouth., Disp: , Rfl:    COMBIVENT RESPIMAT 20-100 MCG/ACT AERS respimat, , Disp: , Rfl:    gabapentin (NEURONTIN) 300 MG capsule, Take 300 mg by mouth 3 (three) times daily., Disp: , Rfl:    guaiFENesin-codeine 100-10 MG/5ML syrup, Take 5 to 10 mL p.o. every 6 hours as needed cough, Disp: 120 mL, Rfl: 0   hyoscyamine (LEVSIN) 0.125 MG tablet, Take by mouth every 4 (four) hours as needed., Disp: , Rfl:    levothyroxine (SYNTHROID, LEVOTHROID) 50 MCG tablet, , Disp: , Rfl:    lisinopril (PRINIVIL,ZESTRIL) 10 MG tablet, , Disp: , Rfl:    Magnesium Bisglycinate (MAG GLYCINATE) 100 MG TABS, Take by mouth., Disp: , Rfl:    melatonin 1 MG TABS tablet, Take by mouth., Disp: , Rfl:    omeprazole (PRILOSEC) 40 MG capsule, Take by mouth., Disp: , Rfl:    predniSONE (DELTASONE) 20 MG tablet, Take 5  tabs p.o. x 2 days, 4 tabs p.o. x 2 days, 3 tabs p.o. x 2 days, 2 tabs p.o. x 2 days, 1 tab p.o. x 2 days, Disp: 30 tablet, Rfl: 0   PROAIR HFA 108 (90 BASE) MCG/ACT inhaler, , Disp: , Rfl:    tiZANidine (ZANAFLEX) 4 MG tablet, Take 4 mg by mouth at bedtime as needed., Disp: , Rfl:   Current Facility-Administered Medications:    betamethasone acetate-betamethasone sodium phosphate (CELESTONE) injection 3 mg, 3 mg, Intra-articular, Once, Evans, Larena Glassman, DPM  Social History: Social History   Tobacco Use   Smoking status: Never   Smokeless tobacco: Never  Vaping Use    Vaping status: Never Used  Substance Use Topics   Alcohol use: No   Drug use: Never    Family Medical History: Family History  Problem Relation Age of Onset   Breast cancer Neg Hx     Physical Examination: Vitals:   03/04/23 1406  BP: 128/82     General: Patient is in no apparent distress. Attention to examination is appropriate.  Neck:   Supple.  Full range of motion.  Respiratory: Patient is breathing without any difficulty.   NEUROLOGICAL:     Awake, alert, oriented to person, place, and time.  Speech is clear and fluent.   Cranial Nerves: Pupils equal round and reactive to light.  Facial tone is symmetric.  Facial sensation is symmetric. Shoulder shrug is symmetric. Tongue protrusion is midline.     Strength: MAEW 5/5  Gait is normal.  Posture shows signs of positive sagittal balance.   Medical Decision Making  Imaging: MRI L spine 08/25/2022  IMPRESSION:  1. Moderate spinal stenosis at L3-L4 with bilateral foraminal disc  protrusions causing severe right neural foraminal stenosis. Modic type I  endplate changes at this level.  2.  6 x 8 mm synovial cyst on the left at L5-S1 abutting the traversing S1  nerve root.  3.  Right facet capsular distention from L2-L3 to L4-L5. Increase T2 signal  in the right L3 and L4 pedicles may be reactive to acute stress related  changes.   Electronically Signed by:  Rutha Bouchard, MD, [facility]  Electronically Signed on:  08/26/2022 6:18 PM   MRI L spine shows L1-S1 lordosis of 37 degrees while supine.  Her lumbar spine x-rays from 2024 show 25 degree lumbar lordosis with a lumbar kyphosis between L1 and L4.  Scoliosis and L spine flex/ext xrays 02/23/2023 FINDINGS: Curvature, convex the right, apex at T11-T12, measuring 20 degrees.   Curvature, convex the left, apex at L3 measuring 27 degrees.   No fracture or bone lesion.   Skeletal structures are demineralized.   Lumbar curvature has increased when  compared to the prior radiographs, which may in part be due to the weight-bearing positioning of the current study. Prior exam lumbar levoscoliosis measures 19 degrees.   IMPRESSION: 1. No fracture or acute finding. 2. Lower thoracic and lumbar spine scoliosis as detailed above.     Electronically Signed   By: Amie Portland M.D.   On: 02/28/2023 12:18   My measurements: Lumbar lordosis 25 degrees Pelvic Incidence 45 degrees Spinopelvic mismatch - 20 degrees Sagittal vertical axis - 8.5 cm Thoracic Kyphosis T3-12 - Approximately 14 degrees L1-3 kyphosis ~ 30 degrees    I have personally reviewed the images and agree with the above interpretation.  Assessment and Plan: Beth Krause is a pleasant 63 y.o. female with acquired scoliosis due to advanced degenerative  changes of the lumbar spine.  She has spondylolisthesis.  She has positive sagittal balance and coronal imbalance. She is having back pain with right-sided sciatica.  She has tried and failed conservative management.  At this point, surgical intervention is a consideration.  I think she will likely need a scoliosis surgery with L1-5 lateral lumbar interbody fusion followed by T10 to the pelvis posterior spinal fusion with L5-S1 transforaminal lumbar interbody fusion.  We will require bone morphogenic protein.  I do not think a smaller operation will help correct her positive sagittal balance, coronal plane abnormality, or lumbar kyphosis from L1-3.  I discussed the planned procedure at length with the patient, including the risks, benefits, alternatives, and indications. The risks discussed include but are not limited to bleeding, infection, need for reoperation, spinal fluid leak, stroke, vision loss, anesthetic complication, coma, paralysis, and even death. I also described in detail that improvement was not guaranteed.  The patient expressed understanding of these risks. I described the surgery in layman's terms, and gave ample  opportunity for questions, which were answered to the best of my ability.  I spent a total of 45 minutes in this patient's care today. This time was spent reviewing pertinent records including imaging studies, obtaining and confirming history, performing a directed evaluation, formulating and discussing my recommendations, and documenting the visit within the medical record.   She would like to think over her options.  We set a goal weight of 195 pounds.  When she is ready to proceed, we will plan to schedule this is a two-stage operation.  Will perform the lateral procedures on 1 day and then perform the posterior operation on a separate day.   Thank you for involving me in the care of this patient.      Krisy Dix K. Myer Haff MD, [facility]

## 2023-03-08 ENCOUNTER — Other Ambulatory Visit: Payer: Self-pay | Admitting: Obstetrics and Gynecology

## 2023-03-08 DIAGNOSIS — Z1231 Encounter for screening mammogram for malignant neoplasm of breast: Secondary | ICD-10-CM

## 2023-04-12 ENCOUNTER — Other Ambulatory Visit: Payer: Self-pay | Admitting: Neurosurgery

## 2023-04-15 ENCOUNTER — Encounter (HOSPITAL_COMMUNITY): Payer: Self-pay

## 2023-04-15 NOTE — Progress Notes (Signed)
 Surgical Instructions   Your procedure is scheduled on Friday April 29, 2023. Report to [facility] Main Entrance "A" at 6:00 A.M., then check in with the Admitting office. Any questions or running late day of surgery: call [phone]  Questions prior to your surgery date: call [phone], Monday-Friday, 8am-4pm. If you experience any cold or flu symptoms such as cough, fever, chills, shortness of breath, etc. between now and your scheduled surgery, please notify us at the above number.     Remember:  Do not eat or drink after midnight the night before your surgery  Take these medicines the morning of surgery with A SIP OF WATER  atorvastatin (LIPITOR)  baclofen (LIORESAL)  BREO ELLIPTA 200-25 MCG/INH AEPB  cetirizine (ZYRTEC)  levothyroxine (SYNTHROID)  omeprazole (PRILOSEC)   May take these medicines IF NEEDED: HYOSCYAMINE SULFATE  PROAIR HFA 108 (90 BASE) MCG/ACT inhaler, please bring with you to the hospital.    Follow your surgeon's instructions on when to stop Asprin.  If no instructions were given by your surgeon then you will need to call the office to get those instructions.     One week prior to surgery, STOP taking any Aleve, Naproxen, Ibuprofen, Motrin, Advil, Goody's, BC's, all herbal medications, fish oil, and non-prescription vitamins.                     Do NOT Smoke (Tobacco/Vaping) for 24 hours prior to your procedure.  If you use a CPAP at night, you may bring your mask/headgear for your overnight stay.   You will be asked to remove any contacts, glasses, piercing's, hearing aid's, dentures/partials prior to surgery. Please bring cases for these items if needed.    Patients discharged the day of surgery will not be allowed to drive home, and someone needs to stay with them for 24 hours.  SURGICAL WAITING ROOM VISITATION Patients may have no more than 2 support people in the waiting area - these visitors may rotate.   Pre-op nurse will coordinate an  appropriate time for 1 ADULT support person, who may not rotate, to accompany patient in pre-op.  Children under the age of 2 must have an adult with them who is not the patient and must remain in the main waiting area with an adult.  If the patient needs to stay at the hospital during part of their recovery, the visitor guidelines for inpatient rooms apply.  Please refer to the [facility] website for the visitor guidelines for any additional information.   If you received a COVID test during your pre-op visit  it is requested that you wear a mask when out in public, stay away from anyone that may not be feeling well and notify your surgeon if you develop symptoms. If you have been in contact with anyone that has tested positive in the last 10 days please notify you surgeon.      Pre-operative 5 CHG Bathing Instructions   You can play a key role in reducing the risk of infection after surgery. Your skin needs to be as free of germs as possible. You can reduce the number of germs on your skin by washing with CHG (chlorhexidine gluconate) soap before surgery. CHG is an antiseptic soap that kills germs and continues to kill germs even after washing.   DO NOT use if you have an allergy to chlorhexidine/CHG or antibacterial soaps. If your skin becomes reddened or irritated, stop using the CHG and notify one of our RNs  at [phone].   Please shower with the CHG soap starting 4 days before surgery using the following schedule:     Please keep in mind the following:  DO NOT shave, including legs and underarms, starting the day of your first shower.   You may shave your face at any point before/day of surgery.  Place clean sheets on your bed the day you start using CHG soap. Use a clean washcloth (not used since being washed) for each shower. DO NOT sleep with pets once you start using the CHG.   CHG Shower Instructions:  Wash your face and private area with normal soap. If you choose to  wash your hair, wash first with your normal shampoo.  After you use shampoo/soap, rinse your hair and body thoroughly to remove shampoo/soap residue.  Turn the water OFF and apply about 3 tablespoons (45 ml) of CHG soap to a CLEAN washcloth.  Apply CHG soap ONLY FROM YOUR NECK DOWN TO YOUR TOES (washing for 3-5 minutes)  DO NOT use CHG soap on face, private areas, open wounds, or sores.  Pay special attention to the area where your surgery is being performed.  If you are having back surgery, having someone wash your back for you may be helpful. Wait 2 minutes after CHG soap is applied, then you may rinse off the CHG soap.  Pat dry with a clean towel  Put on clean clothes/pajamas   If you choose to wear lotion, please use ONLY the CHG-compatible lotions that are listed below.  Additional instructions for the day of surgery: DO NOT APPLY any lotions, deodorants or perfumes.   Do not bring valuables to the hospital. [facility] is not responsible for any belongings/valuables. Do not wear nail polish, gel polish, artificial nails, or any other type of covering on natural nails (fingers and toes) Do not wear jewelry or makeup Put on clean/comfortable clothes.  Please brush your teeth.  Ask your nurse before applying any prescription medications to the skin.     CHG Compatible Lotions   Aveeno Moisturizing lotion  Cetaphil Moisturizing Cream  Cetaphil Moisturizing Lotion  Clairol Herbal Essence Moisturizing Lotion, Dry Skin  Clairol Herbal Essence Moisturizing Lotion, Extra Dry Skin  Clairol Herbal Essence Moisturizing Lotion, Normal Skin  Curel Age Defying Therapeutic Moisturizing Lotion with Alpha Hydroxy  Curel Extreme Care Body Lotion  Curel Soothing Hands Moisturizing Hand Lotion  Curel Therapeutic Moisturizing Cream, Fragrance-Free  Curel Therapeutic Moisturizing Lotion, Fragrance-Free  Curel Therapeutic Moisturizing Lotion, Original Formula  Eucerin Daily Replenishing Lotion   Eucerin Dry Skin Therapy Plus Alpha Hydroxy Crme  Eucerin Dry Skin Therapy Plus Alpha Hydroxy Lotion  Eucerin Original Crme  Eucerin Original Lotion  Eucerin Plus Crme Eucerin Plus Lotion  Eucerin TriLipid Replenishing Lotion  Keri Anti-Bacterial Hand Lotion  Keri Deep Conditioning Original Lotion Dry Skin Formula Softly Scented  Keri Deep Conditioning Original Lotion, Fragrance Free Sensitive Skin Formula  Keri Lotion Fast Absorbing Fragrance Free Sensitive Skin Formula  Keri Lotion Fast Absorbing Softly Scented Dry Skin Formula  Keri Original Lotion  Keri Skin Renewal Lotion Keri Silky Smooth Lotion  Keri Silky Smooth Sensitive Skin Lotion  Nivea Body Creamy Conditioning Oil  Nivea Body Extra Enriched Teacher, adult education Moisturizing Lotion Nivea Crme  Nivea Skin Firming Lotion  NutraDerm 30 Skin Lotion  NutraDerm Skin Lotion  NutraDerm Therapeutic Skin Cream  NutraDerm Therapeutic Skin Lotion  ProShield Protective Hand Cream  Provon moisturizing lotion  Please read over the following fact sheets that you were given.

## 2023-04-18 ENCOUNTER — Encounter (HOSPITAL_COMMUNITY): Payer: Self-pay

## 2023-04-18 ENCOUNTER — Other Ambulatory Visit: Payer: Self-pay

## 2023-04-18 ENCOUNTER — Encounter (HOSPITAL_COMMUNITY)
Admission: RE | Admit: 2023-04-18 | Discharge: 2023-04-18 | Disposition: A | Discharge status: Home / Self Care | Source: Ambulatory Visit | Attending: Neurosurgery | Admitting: Neurosurgery

## 2023-04-18 VITALS — BP 140/78 | HR 84 | Temp 98.3°F | Resp 17 | Ht 62.0 in | Wt 205.2 lb

## 2023-04-18 DIAGNOSIS — Z01818 Encounter for other preprocedural examination: Secondary | ICD-10-CM | POA: Diagnosis present

## 2023-04-18 DIAGNOSIS — I10 Essential (primary) hypertension: Secondary | ICD-10-CM | POA: Diagnosis not present

## 2023-04-18 HISTORY — DX: Anemia, unspecified: D64.9

## 2023-04-18 HISTORY — DX: Gastro-esophageal reflux disease without esophagitis: K21.9

## 2023-04-18 HISTORY — DX: Essential (primary) hypertension: I10

## 2023-04-18 HISTORY — DX: Hypothyroidism, unspecified: E03.9

## 2023-04-18 HISTORY — DX: Pure hypercholesterolemia, unspecified: E78.00

## 2023-04-18 LAB — BASIC METABOLIC PANEL
Anion gap: 9 (ref 5–15)
BUN: 12 mg/dL (ref 8–23)
CO2: 26 mmol/L (ref 22–32)
Calcium: 9.8 mg/dL (ref 8.9–10.3)
Chloride: 96 mmol/L — ABNORMAL LOW (ref 98–111)
Creatinine, Ser: 1.01 mg/dL — ABNORMAL HIGH (ref 0.44–1.00)
GFR, Estimated: >60 mL/min (ref >60)
Glucose, Bld: 116 mg/dL — ABNORMAL HIGH (ref 70–99)
Potassium: 4.5 mmol/L (ref 3.5–5.1)
Sodium: 131 mmol/L — ABNORMAL LOW (ref 135–145)

## 2023-04-18 LAB — CBC
HCT: 40.7 % (ref 36.0–46.0)
Hemoglobin: 13.6 g/dL (ref 12.0–15.0)
MCH: 28.2 pg (ref 26.0–34.0)
MCHC: 33.4 g/dL (ref 30.0–36.0)
MCV: 84.3 fL (ref 80.0–100.0)
Platelets: 231 10*3/uL (ref 150–400)
RBC: 4.83 MIL/uL (ref 3.87–5.11)
RDW: 14.1 % (ref 11.5–15.5)
WBC: 10.3 10*3/uL (ref 4.0–10.5)
nRBC: 0 % (ref 0.0–0.2)

## 2023-04-18 LAB — SURGICAL PCR SCREEN
MRSA, PCR: NEGATIVE
Staphylococcus aureus: NEGATIVE

## 2023-04-18 LAB — TYPE AND SCREEN
ABO/RH(D): A POS
Antibody Screen: NEGATIVE

## 2023-04-18 NOTE — Progress Notes (Signed)
 PCP - Ali Lowe, NP Cardiologist - denies  PPM/ICD - denies   Chest x-ray - 03/01/23 EKG - 04/18/23 Stress Test - denies ECHO - denies Cardiac Cath - denies  Sleep Study - denies   DM- denies  Last dose of GLP1 agonist-  n/a   Blood Thinner Instructions: n/a Aspirin Instructions: f/u with surgeon  ERAS Protcol -no, NPO   COVID TEST- n/a   Anesthesia review: no  Patient denies shortness of breath, fever, cough and chest pain at PAT appointment   All instructions explained to the patient, with a verbal understanding of the material. Patient agrees to go over the instructions while at home for a better understanding.  The opportunity to ask questions was provided.

## 2023-04-20 ENCOUNTER — Ambulatory Visit
Admission: RE | Admit: 2023-04-20 | Discharge: 2023-04-20 | Disposition: A | Discharge status: Home / Self Care | Payer: BC Managed Care – PPO | Source: Ambulatory Visit | Attending: Obstetrics and Gynecology | Admitting: Obstetrics and Gynecology

## 2023-04-20 DIAGNOSIS — Z1231 Encounter for screening mammogram for malignant neoplasm of breast: Secondary | ICD-10-CM | POA: Diagnosis present

## 2023-04-28 NOTE — Anesthesia Preprocedure Evaluation (Signed)
 Anesthesia Evaluation  Patient identified by MRN, date of birth, ID band Patient awake    Reviewed: Allergy & Precautions, NPO status , Patient's Chart, lab work & pertinent test results  Airway Mallampati: II  TM Distance: >3 FB Neck ROM: Full    Dental no notable dental hx. (+) Teeth Intact, Dental Advisory Given   Pulmonary asthma    Pulmonary exam normal breath sounds clear to auscultation       Cardiovascular hypertension, Pt. on medications Normal cardiovascular exam Rhythm:Regular Rate:Normal     Neuro/Psych negative neurological ROS  negative psych ROS   GI/Hepatic Neg liver ROS,GERD  ,,  Endo/Other  Hypothyroidism    Renal/GU Renal InsufficiencyRenal disease  negative genitourinary   Musculoskeletal negative musculoskeletal ROS (+)    Abdominal   Peds  Hematology negative hematology ROS (+)   Anesthesia Other Findings   Reproductive/Obstetrics                             Anesthesia Physical Anesthesia Plan  ASA: 3  Anesthesia Plan: General   Post-op Pain Management: Tylenol PO (pre-op)*, Ketamine IV* and Dilaudid IV   Induction: Intravenous  PONV Risk Score and Plan: 3 and Midazolam, Dexamethasone and Ondansetron  Airway Management Planned: Oral ETT  Additional Equipment:   Intra-op Plan:   Post-operative Plan: Extubation in OR  Informed Consent: I have reviewed the patients History and Physical, chart, labs and discussed the procedure including the risks, benefits and alternatives for the proposed anesthesia with the patient or authorized representative who has indicated his/her understanding and acceptance.     Dental advisory given  Plan Discussed with: CRNA  Anesthesia Plan Comments: (2 IVs)       Anesthesia Quick Evaluation

## 2023-04-29 ENCOUNTER — Ambulatory Visit (HOSPITAL_COMMUNITY): Payer: Self-pay | Admitting: Anesthesiology

## 2023-04-29 ENCOUNTER — Observation Stay (HOSPITAL_COMMUNITY)
Admission: RE | Admit: 2023-04-29 | Discharge: 2023-04-30 | Disposition: A | Discharge status: Home / Self Care | Attending: Neurosurgery | Admitting: Neurosurgery

## 2023-04-29 ENCOUNTER — Other Ambulatory Visit: Payer: Self-pay

## 2023-04-29 ENCOUNTER — Encounter (HOSPITAL_COMMUNITY): Payer: Self-pay | Admitting: Neurosurgery

## 2023-04-29 ENCOUNTER — Ambulatory Visit (HOSPITAL_COMMUNITY)
Admission: RE | Disposition: A | Discharge status: Home / Self Care | Payer: Self-pay | Source: Home / Self Care | Attending: Neurosurgery

## 2023-04-29 ENCOUNTER — Ambulatory Visit (HOSPITAL_COMMUNITY)

## 2023-04-29 DIAGNOSIS — E039 Hypothyroidism, unspecified: Secondary | ICD-10-CM | POA: Insufficient documentation

## 2023-04-29 DIAGNOSIS — M48061 Spinal stenosis, lumbar region without neurogenic claudication: Secondary | ICD-10-CM | POA: Diagnosis not present

## 2023-04-29 DIAGNOSIS — Z79899 Other long term (current) drug therapy: Secondary | ICD-10-CM | POA: Insufficient documentation

## 2023-04-29 DIAGNOSIS — Z7982 Long term (current) use of aspirin: Secondary | ICD-10-CM | POA: Diagnosis not present

## 2023-04-29 DIAGNOSIS — M4316 Spondylolisthesis, lumbar region: Secondary | ICD-10-CM | POA: Diagnosis present

## 2023-04-29 DIAGNOSIS — I10 Essential (primary) hypertension: Secondary | ICD-10-CM | POA: Diagnosis not present

## 2023-04-29 DIAGNOSIS — M5416 Radiculopathy, lumbar region: Secondary | ICD-10-CM | POA: Insufficient documentation

## 2023-04-29 DIAGNOSIS — M722 Plantar fascial fibromatosis: Secondary | ICD-10-CM

## 2023-04-29 DIAGNOSIS — J45909 Unspecified asthma, uncomplicated: Secondary | ICD-10-CM | POA: Diagnosis not present

## 2023-04-29 DIAGNOSIS — M4310 Spondylolisthesis, site unspecified: Principal | ICD-10-CM | POA: Diagnosis present

## 2023-04-29 HISTORY — PX: ANTERIOR LATERAL LUMBAR FUSION WITH PERCUTANEOUS SCREW 2 LEVEL: SHX5554

## 2023-04-29 LAB — ABO/RH: ABO/RH(D): A POS

## 2023-04-29 SURGERY — ANTERIOR LATERAL LUMBAR FUSION WITH PERCUTANEOUS SCREW 2 LEVEL
Anesthesia: General | Laterality: Left

## 2023-04-29 MED ORDER — ASPIRIN 81 MG PO TBEC
81.0000 mg | DELAYED_RELEASE_TABLET | Freq: Every day | ORAL | Status: DC
  Administered 2023-04-29: 81 mg via ORAL
  Filled 2023-04-29: qty 1

## 2023-04-29 MED ORDER — KETOROLAC TROMETHAMINE 30 MG/ML IJ SOLN
30.0000 mg | Freq: Four times a day (QID) | INTRAMUSCULAR | Status: DC
  Filled 2023-04-29 (×3): qty 1

## 2023-04-29 MED ORDER — DIAZEPAM 5 MG PO TABS
5.0000 mg | ORAL_TABLET | Freq: Four times a day (QID) | ORAL | Status: DC | PRN
  Administered 2023-04-29 – 2023-04-30 (×3): 5 mg via ORAL
  Filled 2023-04-29 (×3): qty 1

## 2023-04-29 MED ORDER — BETAMETHASONE SOD PHOS & ACET 6 (3-3) MG/ML IJ SUSP
3.0000 mg | Freq: Once | INTRAMUSCULAR | Status: DC
  Filled 2023-04-29: qty 0.5

## 2023-04-29 MED ORDER — ONDANSETRON HCL 4 MG/2ML IJ SOLN
4.0000 mg | Freq: Four times a day (QID) | INTRAMUSCULAR | Status: DC | PRN
  Filled 2023-04-29: qty 2

## 2023-04-29 MED ORDER — CHLORHEXIDINE GLUCONATE CLOTH 2 % EX PADS
6.0000 | MEDICATED_PAD | Freq: Once | CUTANEOUS | Status: DC
Start: 2023-04-29 — End: 2023-04-29

## 2023-04-29 MED ORDER — ONDANSETRON HCL 4 MG/2ML IJ SOLN
INTRAMUSCULAR | Status: AC
  Filled 2023-04-29: qty 2

## 2023-04-29 MED ORDER — KETAMINE HCL 50 MG/5ML IJ SOSY
PREFILLED_SYRINGE | INTRAMUSCULAR | Status: AC
  Filled 2023-04-29: qty 5

## 2023-04-29 MED ORDER — LEVOTHYROXINE SODIUM 100 MCG PO TABS
100.0000 ug | ORAL_TABLET | Freq: Every day | ORAL | Status: DC
  Administered 2023-04-30: 100 ug via ORAL
  Filled 2023-04-29: qty 1

## 2023-04-29 MED ORDER — PANTOPRAZOLE SODIUM 40 MG PO TBEC
40.0000 mg | DELAYED_RELEASE_TABLET | Freq: Every day | ORAL | Status: DC
Start: 2023-04-30 — End: 2023-04-30
  Administered 2023-04-30: 40 mg via ORAL
  Filled 2023-04-29: qty 1

## 2023-04-29 MED ORDER — KETOROLAC TROMETHAMINE 15 MG/ML IJ SOLN
30.0000 mg | Freq: Four times a day (QID) | INTRAMUSCULAR | Status: DC
  Administered 2023-04-29 – 2023-04-30 (×3): 30 mg via INTRAVENOUS
  Filled 2023-04-29 (×3): qty 2

## 2023-04-29 MED ORDER — LIDOCAINE 2% (20 MG/ML) 5 ML SYRINGE
INTRAMUSCULAR | Status: DC | PRN
  Administered 2023-04-29: 80 mg via INTRAVENOUS

## 2023-04-29 MED ORDER — FENTANYL CITRATE (PF) 250 MCG/5ML IJ SOLN
INTRAMUSCULAR | Status: DC | PRN
  Administered 2023-04-29: 50 ug via INTRAVENOUS
  Administered 2023-04-29: 100 ug via INTRAVENOUS
  Administered 2023-04-29 (×2): 50 ug via INTRAVENOUS

## 2023-04-29 MED ORDER — DIPHENHYDRAMINE HCL 50 MG/ML IJ SOLN
INTRAMUSCULAR | Status: DC | PRN
Start: 2023-04-29 — End: 2023-04-29
  Administered 2023-04-29: 25 mg via INTRAVENOUS

## 2023-04-29 MED ORDER — OXYCODONE HCL 5 MG PO TABS
ORAL_TABLET | ORAL | Status: AC
  Administered 2023-04-29: 5 mg via ORAL
  Filled 2023-04-29: qty 1

## 2023-04-29 MED ORDER — PHENOL 1.4 % MT LIQD: 1.0000 | OROMUCOSAL | Status: DC | PRN

## 2023-04-29 MED ORDER — SODIUM CHLORIDE 0.9% FLUSH
3.0000 mL | Freq: Two times a day (BID) | INTRAVENOUS | Status: DC
  Administered 2023-04-29 (×2): 3 mL via INTRAVENOUS

## 2023-04-29 MED ORDER — SUCCINYLCHOLINE CHLORIDE 200 MG/10ML IV SOSY
PREFILLED_SYRINGE | INTRAVENOUS | Status: DC | PRN
  Administered 2023-04-29: 160 mg via INTRAVENOUS

## 2023-04-29 MED ORDER — MELATONIN 5 MG PO TABS: 5.0000 mg | ORAL_TABLET | Freq: Every evening | ORAL | Status: DC | PRN

## 2023-04-29 MED ORDER — BISACODYL 10 MG RE SUPP: 10.0000 mg | Freq: Every day | RECTAL | Status: DC | PRN

## 2023-04-29 MED ORDER — FLEET ENEMA RE ENEM: 1.0000 | ENEMA | Freq: Once | RECTAL | Status: DC | PRN

## 2023-04-29 MED ORDER — MIDAZOLAM HCL 2 MG/2ML IJ SOLN
INTRAMUSCULAR | Status: AC
  Filled 2023-04-29: qty 2

## 2023-04-29 MED ORDER — HYDROMORPHONE HCL 1 MG/ML IJ SOLN
0.2500 mg | INTRAMUSCULAR | Status: DC | PRN
Start: 2023-04-29 — End: 2023-04-29
  Administered 2023-04-29 (×2): 0.25 mg via INTRAVENOUS

## 2023-04-29 MED ORDER — VITAMIN D 25 MCG (1000 UNIT) PO TABS
10000.0000 [IU] | ORAL_TABLET | ORAL | Status: DC
Start: 2023-04-29 — End: 2023-04-30
  Administered 2023-04-29: 10000 [IU] via ORAL
  Filled 2023-04-29: qty 10

## 2023-04-29 MED ORDER — POLYETHYLENE GLYCOL 3350 17 G PO PACK: 17.0000 g | PACK | Freq: Every day | ORAL | Status: DC | PRN

## 2023-04-29 MED ORDER — SODIUM CHLORIDE 0.9% FLUSH: 3.0000 mL | INTRAVENOUS | Status: DC | PRN

## 2023-04-29 MED ORDER — PROPOFOL 10 MG/ML IV BOLUS
INTRAVENOUS | Status: DC | PRN
  Administered 2023-04-29: 150 mg via INTRAVENOUS

## 2023-04-29 MED ORDER — LACTATED RINGERS IV SOLN
INTRAVENOUS | Status: DC
Start: 2023-04-29 — End: 2023-04-29

## 2023-04-29 MED ORDER — LIDOCAINE 2% (20 MG/ML) 5 ML SYRINGE
INTRAMUSCULAR | Status: AC
  Filled 2023-04-29: qty 5

## 2023-04-29 MED ORDER — B COMPLEX-C PO TABS
1.0000 | ORAL_TABLET | Freq: Every day | ORAL | Status: DC
  Administered 2023-04-29: 1 via ORAL
  Filled 2023-04-29 (×2): qty 1

## 2023-04-29 MED ORDER — CHLORHEXIDINE GLUCONATE CLOTH 2 % EX PADS: 6.0000 | MEDICATED_PAD | Freq: Once | CUTANEOUS | Status: DC

## 2023-04-29 MED ORDER — CEFAZOLIN SODIUM-DEXTROSE 2-4 GM/100ML-% IV SOLN
2.0000 g | INTRAVENOUS | Status: AC
  Administered 2023-04-29: 2 g via INTRAVENOUS
  Filled 2023-04-29: qty 100

## 2023-04-29 MED ORDER — OXYCODONE HCL 5 MG PO TABS: 5.0000 mg | ORAL_TABLET | Freq: Once | ORAL | Status: AC | PRN

## 2023-04-29 MED ORDER — FENTANYL CITRATE (PF) 250 MCG/5ML IJ SOLN
INTRAMUSCULAR | Status: AC
  Filled 2023-04-29: qty 5

## 2023-04-29 MED ORDER — HYDROMORPHONE HCL 1 MG/ML IJ SOLN
INTRAMUSCULAR | Status: DC | PRN
  Administered 2023-04-29: .5 mg via INTRAVENOUS

## 2023-04-29 MED ORDER — SODIUM CHLORIDE 0.9 % IV SOLN: 250.0000 mL | INTRAVENOUS | Status: DC

## 2023-04-29 MED ORDER — PHENYLEPHRINE HCL-NACL 20-0.9 MG/250ML-% IV SOLN
INTRAVENOUS | Status: DC | PRN
  Administered 2023-04-29: 50 ug/min via INTRAVENOUS

## 2023-04-29 MED ORDER — BUPIVACAINE HCL (PF) 0.25 % IJ SOLN
INTRAMUSCULAR | Status: DC | PRN
  Administered 2023-04-29: 20 mL

## 2023-04-29 MED ORDER — PROPOFOL 10 MG/ML IV BOLUS
INTRAVENOUS | Status: AC
  Filled 2023-04-29: qty 20

## 2023-04-29 MED ORDER — ACETAMINOPHEN 650 MG RE SUPP: 650.0000 mg | RECTAL | Status: DC | PRN

## 2023-04-29 MED ORDER — ATORVASTATIN CALCIUM 10 MG PO TABS
20.0000 mg | ORAL_TABLET | Freq: Every day | ORAL | Status: DC
Start: 2023-04-29 — End: 2023-04-30
  Administered 2023-04-29 – 2023-04-30 (×2): 20 mg via ORAL
  Filled 2023-04-29 (×2): qty 2

## 2023-04-29 MED ORDER — DIPHENHYDRAMINE HCL 50 MG/ML IJ SOLN
INTRAMUSCULAR | Status: AC
  Filled 2023-04-29: qty 1

## 2023-04-29 MED ORDER — MIDAZOLAM HCL 2 MG/2ML IJ SOLN
INTRAMUSCULAR | Status: DC | PRN
  Administered 2023-04-29: 2 mg via INTRAVENOUS

## 2023-04-29 MED ORDER — ONDANSETRON HCL 4 MG/2ML IJ SOLN
INTRAMUSCULAR | Status: DC | PRN
  Administered 2023-04-29: 4 mg via INTRAVENOUS

## 2023-04-29 MED ORDER — DEXAMETHASONE SODIUM PHOSPHATE 10 MG/ML IJ SOLN
INTRAMUSCULAR | Status: AC
  Filled 2023-04-29: qty 1

## 2023-04-29 MED ORDER — DEXMEDETOMIDINE HCL IN NACL 80 MCG/20ML IV SOLN
INTRAVENOUS | Status: AC
  Filled 2023-04-29: qty 20

## 2023-04-29 MED ORDER — CHLORHEXIDINE GLUCONATE 0.12 % MT SOLN
15.0000 mL | Freq: Once | OROMUCOSAL | Status: AC
  Administered 2023-04-29: 15 mL via OROMUCOSAL
  Filled 2023-04-29: qty 15

## 2023-04-29 MED ORDER — ONDANSETRON HCL 4 MG PO TABS: 4.0000 mg | ORAL_TABLET | Freq: Four times a day (QID) | ORAL | Status: DC | PRN

## 2023-04-29 MED ORDER — ALBUTEROL SULFATE (2.5 MG/3ML) 0.083% IN NEBU: 2.5000 mg | INHALATION_SOLUTION | Freq: Three times a day (TID) | RESPIRATORY_TRACT | Status: DC | PRN

## 2023-04-29 MED ORDER — GLYCOPYRROLATE PF 0.2 MG/ML IJ SOSY
PREFILLED_SYRINGE | INTRAMUSCULAR | Status: DC | PRN
  Administered 2023-04-29: .2 mg via INTRAVENOUS

## 2023-04-29 MED ORDER — AMISULPRIDE (ANTIEMETIC) 5 MG/2ML IV SOLN: 10.0000 mg | Freq: Once | INTRAVENOUS | Status: DC | PRN

## 2023-04-29 MED ORDER — ACETAMINOPHEN 325 MG PO TABS: 650.0000 mg | ORAL_TABLET | ORAL | Status: DC | PRN

## 2023-04-29 MED ORDER — PHENYLEPHRINE HCL-NACL 20-0.9 MG/250ML-% IV SOLN
INTRAVENOUS | Status: AC
  Filled 2023-04-29: qty 250

## 2023-04-29 MED ORDER — LACTATED RINGERS IV SOLN: INTRAVENOUS | Status: DC | PRN

## 2023-04-29 MED ORDER — BACLOFEN 10 MG PO TABS
10.0000 mg | ORAL_TABLET | Freq: Three times a day (TID) | ORAL | Status: DC
  Administered 2023-04-29 – 2023-04-30 (×3): 10 mg via ORAL
  Filled 2023-04-29 (×3): qty 1

## 2023-04-29 MED ORDER — DEXMEDETOMIDINE HCL IN NACL 80 MCG/20ML IV SOLN
INTRAVENOUS | Status: DC | PRN
  Administered 2023-04-29 (×3): 4 ug via INTRAVENOUS

## 2023-04-29 MED ORDER — CEFAZOLIN SODIUM-DEXTROSE 1-4 GM/50ML-% IV SOLN
1.0000 g | Freq: Three times a day (TID) | INTRAVENOUS | Status: AC
  Administered 2023-04-29 (×2): 1 g via INTRAVENOUS
  Filled 2023-04-29 (×2): qty 50

## 2023-04-29 MED ORDER — OXYCODONE HCL 5 MG/5ML PO SOLN: 5.0000 mg | Freq: Once | ORAL | Status: AC | PRN

## 2023-04-29 MED ORDER — ROCURONIUM BROMIDE 10 MG/ML (PF) SYRINGE
PREFILLED_SYRINGE | INTRAVENOUS | Status: DC | PRN
  Administered 2023-04-29: 10 mg via INTRAVENOUS

## 2023-04-29 MED ORDER — OXYCODONE HCL 5 MG PO TABS
10.0000 mg | ORAL_TABLET | ORAL | Status: DC | PRN
  Administered 2023-04-29 – 2023-04-30 (×4): 10 mg via ORAL
  Filled 2023-04-29 (×4): qty 2

## 2023-04-29 MED ORDER — GLYCOPYRROLATE PF 0.2 MG/ML IJ SOSY
PREFILLED_SYRINGE | INTRAMUSCULAR | Status: AC
  Filled 2023-04-29: qty 1

## 2023-04-29 MED ORDER — BUPIVACAINE HCL (PF) 0.25 % IJ SOLN
INTRAMUSCULAR | Status: AC
  Filled 2023-04-29: qty 30

## 2023-04-29 MED ORDER — HYDROMORPHONE HCL 1 MG/ML IJ SOLN
INTRAMUSCULAR | Status: AC
  Filled 2023-04-29: qty 1

## 2023-04-29 MED ORDER — HYDROMORPHONE HCL 1 MG/ML IJ SOLN
INTRAMUSCULAR | Status: AC
  Filled 2023-04-29: qty 0.5

## 2023-04-29 MED ORDER — PHENYLEPHRINE 80 MCG/ML (10ML) SYRINGE FOR IV PUSH (FOR BLOOD PRESSURE SUPPORT)
PREFILLED_SYRINGE | INTRAVENOUS | Status: DC | PRN
Start: 2023-04-29 — End: 2023-04-29
  Administered 2023-04-29: 80 ug via INTRAVENOUS
  Administered 2023-04-29: 240 ug via INTRAVENOUS
  Administered 2023-04-29: 80 ug via INTRAVENOUS

## 2023-04-29 MED ORDER — PROPOFOL 500 MG/50ML IV EMUL
INTRAVENOUS | Status: DC | PRN
  Administered 2023-04-29: 75 ug/kg/min via INTRAVENOUS

## 2023-04-29 MED ORDER — LISINOPRIL 10 MG PO TABS
10.0000 mg | ORAL_TABLET | Freq: Every day | ORAL | Status: DC
  Administered 2023-04-30: 10 mg via ORAL
  Filled 2023-04-29: qty 1

## 2023-04-29 MED ORDER — HYDROCODONE-ACETAMINOPHEN 10-325 MG PO TABS
1.0000 | ORAL_TABLET | ORAL | Status: DC | PRN
  Administered 2023-04-29: 1 via ORAL
  Filled 2023-04-29: qty 1

## 2023-04-29 MED ORDER — PHENYLEPHRINE 80 MCG/ML (10ML) SYRINGE FOR IV PUSH (FOR BLOOD PRESSURE SUPPORT)
PREFILLED_SYRINGE | INTRAVENOUS | Status: AC
  Filled 2023-04-29: qty 10

## 2023-04-29 MED ORDER — MENTHOL 3 MG MT LOZG: 1.0000 | LOZENGE | OROMUCOSAL | Status: DC | PRN

## 2023-04-29 MED ORDER — ACETAMINOPHEN 500 MG PO TABS
1000.0000 mg | ORAL_TABLET | Freq: Once | ORAL | Status: AC
  Administered 2023-04-29: 1000 mg via ORAL
  Filled 2023-04-29: qty 2

## 2023-04-29 MED ORDER — KETAMINE HCL 50 MG/5ML IJ SOSY
PREFILLED_SYRINGE | INTRAMUSCULAR | Status: DC | PRN
  Administered 2023-04-29: 20 mg via INTRAVENOUS
  Administered 2023-04-29: 30 mg via INTRAVENOUS

## 2023-04-29 MED ORDER — THROMBIN 5000 UNITS EX KIT
PACK | CUTANEOUS | Status: AC
  Filled 2023-04-29: qty 2

## 2023-04-29 MED ORDER — ROCURONIUM BROMIDE 10 MG/ML (PF) SYRINGE
PREFILLED_SYRINGE | INTRAVENOUS | Status: AC
  Filled 2023-04-29: qty 10

## 2023-04-29 MED ORDER — 0.9 % SODIUM CHLORIDE (POUR BTL) OPTIME
TOPICAL | Status: DC | PRN
  Administered 2023-04-29 (×2): 1000 mL

## 2023-04-29 MED ORDER — HYDROMORPHONE HCL 1 MG/ML IJ SOLN: 1.0000 mg | INTRAMUSCULAR | Status: DC | PRN

## 2023-04-29 MED ORDER — SUCCINYLCHOLINE CHLORIDE 200 MG/10ML IV SOSY
PREFILLED_SYRINGE | INTRAVENOUS | Status: AC
  Filled 2023-04-29: qty 10

## 2023-04-29 MED ORDER — THROMBIN (RECOMBINANT) 5000 UNITS EX SOLR
CUTANEOUS | Status: DC | PRN
  Administered 2023-04-29: 10 mL via TOPICAL

## 2023-04-29 MED ORDER — ORAL CARE MOUTH RINSE: 15.0000 mL | Freq: Once | OROMUCOSAL | Status: AC

## 2023-04-29 MED ORDER — LORATADINE 10 MG PO TABS
10.0000 mg | ORAL_TABLET | Freq: Every day | ORAL | Status: DC
  Administered 2023-04-30: 10 mg via ORAL
  Filled 2023-04-29: qty 1

## 2023-04-29 MED ORDER — DEXAMETHASONE SODIUM PHOSPHATE 10 MG/ML IJ SOLN
INTRAMUSCULAR | Status: DC | PRN
  Administered 2023-04-29: 10 mg via INTRAVENOUS

## 2023-04-29 MED ORDER — FLUTICASONE FUROATE-VILANTEROL 200-25 MCG/ACT IN AEPB
1.0000 | INHALATION_SPRAY | Freq: Every morning | RESPIRATORY_TRACT | Status: DC
  Filled 2023-04-29: qty 28

## 2023-04-29 SURGICAL SUPPLY — 50 items
BAG COUNTER SPONGE SURGICOUNT (BAG) ×1 IMPLANT
BENZOIN TINCTURE PRP APPL 2/3 (GAUZE/BANDAGES/DRESSINGS) ×1 IMPLANT
BLADE CLIPPER SURG (BLADE) IMPLANT
BONE MATRIX OSTEOCEL PRO MED (Bone Implant) IMPLANT
BUR MATCHSTICK NEURO 3.0 LAGG (BURR) IMPLANT
CAGE MODULUS XL 8X18X50 - 10 (Cage) IMPLANT
CATH FOLEY 2WAY SLVR 5CC 12FR (CATHETERS) IMPLANT
COVER BACK TABLE 60X90IN (DRAPES) ×1 IMPLANT
DERMABOND ADVANCED .7 DNX12 (GAUZE/BANDAGES/DRESSINGS) ×2 IMPLANT
DRAPE C-ARM 42X72 X-RAY (DRAPES) ×1 IMPLANT
DRAPE C-ARMOR (DRAPES) ×1 IMPLANT
DRAPE LAPAROTOMY 100X72X124 (DRAPES) ×1 IMPLANT
DRAPE SURG 17X23 STRL (DRAPES) ×2 IMPLANT
DRSG OPSITE POSTOP 4X6 (GAUZE/BANDAGES/DRESSINGS) IMPLANT
DURAPREP 26ML APPLICATOR (WOUND CARE) ×1 IMPLANT
ELECT NVM5 SURFACE MEP/EMG (ELECTRODE) IMPLANT
ELECT REM PT RETURN 9FT ADLT (ELECTROSURGICAL) ×1 IMPLANT
ELECTRODE REM PT RTRN 9FT ADLT (ELECTROSURGICAL) ×1 IMPLANT
GAUZE 4X4 16PLY ~~LOC~~+RFID DBL (SPONGE) IMPLANT
GLOVE BIO SURGEON STRL SZ 6 (GLOVE) ×1 IMPLANT
GLOVE BIOGEL PI IND STRL 6.5 (GLOVE) ×1 IMPLANT
GLOVE ECLIPSE 9.0 STRL (GLOVE) ×1 IMPLANT
GLOVE EXAM NITRILE XL STR (GLOVE) IMPLANT
GOWN STRL REUS W/ TWL LRG LVL3 (GOWN DISPOSABLE) IMPLANT
GOWN STRL REUS W/ TWL XL LVL3 (GOWN DISPOSABLE) ×2 IMPLANT
GOWN STRL REUS W/TWL 2XL LVL3 (GOWN DISPOSABLE) IMPLANT
GUIDEWIRE NITINOL BEVEL TIP (WIRE) IMPLANT
KIT BASIN OR (CUSTOM PROCEDURE TRAY) ×1 IMPLANT
KIT DILATOR XLIF 5 (KITS) IMPLANT
KIT SURGICAL ACCESS MAXCESS 4 (KITS) IMPLANT
KIT TURNOVER KIT B (KITS) ×1 IMPLANT
MODULUS XLW 10X22X55MM 10 (Spine Construct) IMPLANT
NDL HYPO 22X1.5 SAFETY MO (MISCELLANEOUS) ×1 IMPLANT
NDL I-PASS III (NEEDLE) IMPLANT
NEEDLE HYPO 22X1.5 SAFETY MO (MISCELLANEOUS) ×1 IMPLANT
NEEDLE I-PASS III (NEEDLE) ×1 IMPLANT
NS IRRIG 1000ML POUR BTL (IV SOLUTION) ×1 IMPLANT
PACK LAMINECTOMY NEURO (CUSTOM PROCEDURE TRAY) ×1 IMPLANT
ROD RELINE MAS LORD 5.5X75MM (Rod) IMPLANT
SCREW LOCK RELINE 5.5 TULIP (Screw) IMPLANT
SCREW RELINE PA 6.5X45 (Screw) IMPLANT
SPONGE SURGIFOAM ABS GEL SZ50 (HEMOSTASIS) IMPLANT
SPONGE T-LAP 4X18 ~~LOC~~+RFID (SPONGE) IMPLANT
STRIP CLOSURE SKIN 1/2X4 (GAUZE/BANDAGES/DRESSINGS) ×1 IMPLANT
SUT VIC AB 2-0 CT1 18 (SUTURE) ×2 IMPLANT
SUT VIC AB 3-0 SH 8-18 (SUTURE) ×2 IMPLANT
TOWEL GREEN STERILE (TOWEL DISPOSABLE) ×1 IMPLANT
TOWEL GREEN STERILE FF (TOWEL DISPOSABLE) ×1 IMPLANT
TRAY FOLEY MTR SLVR 16FR STAT (SET/KITS/TRAYS/PACK) ×1 IMPLANT
WATER STERILE IRR 1000ML POUR (IV SOLUTION) ×1 IMPLANT

## 2023-04-29 NOTE — Anesthesia Postprocedure Evaluation (Signed)
 Anesthesia Post Note  Patient: Beth Krause  Procedure(s) Performed: ANTERIOR LATERAL LUMBAR FUSION WITH PERCUTANEOUS SCREW 2 LEVEL (Left)     Patient location during evaluation: PACU Anesthesia Type: General Level of consciousness: awake and alert Pain management: pain level controlled Vital Signs Assessment: post-procedure vital signs reviewed and stable Respiratory status: spontaneous breathing, nonlabored ventilation, respiratory function stable and patient connected to nasal cannula oxygen Cardiovascular status: blood pressure returned to baseline and stable Postop Assessment: no apparent nausea or vomiting Anesthetic complications: no  No notable events documented.  Last Vitals:  Vitals:   04/29/23 1200 04/29/23 1232  BP: 117/70 (!) 102/55  Pulse: 82 77  Resp: 15 16  Temp: 36.8 C (!) 36.4 C  SpO2: 94% 94%    Last Pain:  Vitals:   04/29/23 1232  TempSrc: Oral  PainSc:                  [name]

## 2023-04-29 NOTE — H&P (Signed)
 Beth Krause is an 63 y.o. female.   Chief Complaint: Back pain HPI: 63 year old female with chronic and progressively worsening back pain with radiation into her right anterior thigh with associated numbness and some weakness.  Workup demonstrates evidence of marked multilevel lumbar degeneration worse at L2-3 and L3-4 where she has disc base collapse with degenerative lateral listhesis and severe foraminal stenosis at L3-4 on the right.  Patient has failed conservative management presents now for two-level lumbar decompression and fusion in hopes to prove her symptoms.  Past Medical History:  Diagnosis Date   Anemia    hx   Asthma    GERD (gastroesophageal reflux disease)    High cholesterol    Hypertension    Hypothyroidism     Past Surgical History:  Procedure Laterality Date   CHOLECYSTECTOMY      Family History  Problem Relation Age of Onset   Breast cancer Neg Hx    Social History:  reports that she has never smoked. She has never used smokeless tobacco. She reports that she does not drink alcohol and does not use drugs.  Allergies:  Allergies  Allergen Reactions   Influenza Vaccines Other (See Comments)    Had bad experiences with the vaccine   Other Other (See Comments)    Pollen/ Hay fever    Facility-Administered Medications Prior to Admission  Medication Dose Route Frequency Provider Last Rate Last Admin   betamethasone acetate-betamethasone sodium phosphate (CELESTONE) injection 3 mg  3 mg Intra-articular Once Felecia Shelling, DPM       Medications Prior to Admission  Medication Sig Dispense Refill   aspirin EC 81 MG tablet Take 81 mg by mouth at bedtime. Swallow whole.     atorvastatin (LIPITOR) 20 MG tablet Take 20 mg by mouth daily.     b complex vitamins capsule Take 1 capsule by mouth daily.     baclofen (LIORESAL) 10 MG tablet Take 10 mg by mouth 3 (three) times daily.     BREO ELLIPTA 200-25 MCG/INH AEPB Inhale 1 puff into the lungs in the morning.      cetirizine (ZYRTEC) 10 MG tablet Take 10 mg by mouth in the morning.     Cholecalciferol (VITAMIN D3) 250 MCG (10000 UT) TABS Take 10,000 Units by mouth 3 (three) times a week.     HYOSCYAMINE SULFATE PO Take by mouth every 4 (four) hours as needed. (Patient not taking: Reported on 04/18/2023)     levothyroxine (SYNTHROID) 75 MCG tablet Take 100 mcg by mouth daily before breakfast.     lisinopril (PRINIVIL,ZESTRIL) 10 MG tablet Take 10 mg by mouth daily.     magnesium gluconate (MAGONATE) 500 MG tablet Take 500 mg by mouth daily.     melatonin 5 MG TABS Take 5 mg by mouth at bedtime as needed (Sleep).     omeprazole (PRILOSEC) 20 MG capsule Take 20 mg by mouth daily.     PROAIR HFA 108 (90 BASE) MCG/ACT inhaler Inhale 2 puffs into the lungs every 8 (eight) hours as needed (Asthma).     VITAMIN K PO Take 20 mg by mouth daily.      Results for orders placed or performed during the hospital encounter of 04/29/23 (from the past 48 hours)  ABO/Rh     Status: None (Preliminary result)   Collection Time: 04/29/23  7:15 AM  Result Value Ref Range   ABO/RH(D) PENDING    No results found.  Pertinent items noted in HPI and  remainder of comprehensive ROS otherwise negative.  Blood pressure (!) 150/79, pulse 85, temperature 98.1 F (36.7 C), temperature source Oral, resp. rate 18, height 5\' 2"  (1.575 m), weight 89.8 kg, SpO2 98%.  Patient is awake and alert.  She is oriented and appropriate.  Speech is fluent.  Judgment insight are intact.  Cranial nerve function normal bilaterally motor examination reveals some mild weakness of the right quadriceps motor group otherwise motor strength intact.  Sensory examination reveals decreased sensation pinprick light touch in her right L3 dermatome.  Deep tendon reflexes hypoactive but symmetric.  No evidence of long track signs.  Examination head ears eyes nose and throat is unremarkable chest and abdomen are benign.  Extremities are free from injury  deformity. Assessment/Plan L2-3, L3-4 severe disc degeneration with degenerative spondylolisthesis and foraminal stenosis.  Plan left L2-3 and left L3-4 anterior lateral retroperitoneal interbody decompression and fusion utilizing interbody cage and allograft coupled with posterior percutaneous pedicle screw fixation.  Risks and benefits been explained.  Patient wishes to proceed.  Kathaleen Maser Rawad Bochicchio 04/29/2023, 7:46 AM

## 2023-04-29 NOTE — Transfer of Care (Signed)
 Immediate Anesthesia Transfer of Care Note  Patient: Beth Krause  Procedure(s) Performed: ANTERIOR LATERAL LUMBAR FUSION WITH PERCUTANEOUS SCREW 2 LEVEL (Left)  Patient Location: PACU  Anesthesia Type:General  Level of Consciousness: drowsy  Airway & Oxygen Therapy: Patient Spontanous Breathing and Patient connected to nasal cannula oxygen  Post-op Assessment: Report given to RN and Post -op Vital signs reviewed and stable  Post vital signs: Reviewed and stable  Last Vitals:  Vitals Value Taken Time  BP 102/58 04/29/23 1055  Temp    Pulse 87 04/29/23 1058  Resp 13 04/29/23 1058  SpO2 96 % 04/29/23 1058  Vitals shown include unfiled device data.  Last Pain:  Vitals:   04/29/23 0703  TempSrc:   PainSc: 0-No pain         Complications: No notable events documented.

## 2023-04-29 NOTE — Op Note (Signed)
 Date of procedure: 04/29/2023  Date of dictation: Same  Service: Neurosurgery  Preoperative diagnosis: L2-3, L3-4 severe degenerative disc disease with foraminal stenosis and degenerative spondylolisthesis with radiculopathy  Postoperative diagnosis: Same  Procedure Name: Left L2-3, left L3-4 anterior lateral retroperitoneal interbody decompression and fusion utilizing interbody cage and morselized allograft  L2-3-4 posterior percutaneous pedicle screw fixation  Surgeon:Kareema Keitt A.Shakeyla Giebler, M.D.  Asst. Surgeon: Doran Durand, NP  Anesthesia: General  Indication: 63 year old female with chronic and progressively worsening back pain with right lower extremity radicular symptoms failing conservative management workup demonstrates evidence of marked lumbar disc generation with the space collapse and degenerative lateral listhesis of L3 on L4 with severe to space collapse and angulation and scoliosis at L2-3 also with significant foraminal stenosis.  Patient presents now for left lateral interbody decompression and fusion in hopes of improving her symptoms.  Operative note: After induction of anesthesia, patient positioned in the right lateral decubitus position and appropriately padded.  The bed was positioned to flex the patient laterally toward the right.  Left abdomen and flank and lumbar region prepped and draped sterilely.  Localizing fluoroscopy was used.  Intraoperative neuromonitoring was used throughout.  Incision made over the body of L3 on the left side.  A secondary incision was made in the left flank.  Exploring the left flank incision blunt dissection was then made into the retroperitoneal space.  The peritoneal sac and contents were reflected anteriorly.  Returning to the lateral incision a dilator was then passed and docked into the left L3-4 disc space and a Tran psoas fashion.  Intraoperative neuromonitoring was used to ensure safe passage.  A guidewire was then passed into the L2-3 disc space.   The dilators were sequentially enlarged and once again neuromonitoring was actively performed to ensure safe passage.  Self-retaining tractor was placed.  This was opened slightly.  The disc base was inspected.  The disc base was directly stimulated and again no elements of the lumbar plexus were present.  A shim was then placed into the lateral disc space at L3-4.  The retractor was opened further.  The space was cleaned overlying psoas muscle.  The disc base is then incised with a 15 blade.  Discectomy then performed using pituitary rongeurs, Kerrison rongeurs and various curettes.  Contralateral release was performed using Cobb elevators after adequate preparation of the endplates both superiorly and inferiorly the disc base was then expanded using intraoperative trials.  A 10 mm x 22 mm orthotic trial was found to fit the interspace snugly and provide good reduction of the patient's deformity.  A 10 mm x 22 mm x 55 mm 3D titanium printed cage with lordosis was then packed with Osteocel plus.  This was then impacted into place and found to be well-positioned in both AP and lateral views on fluoroscopy.  Hemostasis was ensured.  The retractor system was removed.  Attention then placed to the L2-3 disc space.  Dilators were passed and docked to the L2-3 space in a similar fashion.  The disc base was visualized using the self-retaining retractor and soft tissue was dissected free from overlying the disc space.  Once again the disc base was directly stimulated and found to be free of any branches from the lumbar plexus.  The disc base was incised.  The disc base was very collapsed.  This was opened slightly using a Cobb elevator.  A contralateral release was once again performed using the Cobb elevator.  The disc space was dilated this time  to 8 mm.  The disc base was prepared.  An 8 mm x 18 mm x 50 mm NuVasive 3D printing cage was then packed with Osteocel plus and impacted in the place.  Once again this was  confirmed to be in good position both AP and lateral fluoroscopy.  Retractor system was removed.  The bed was returned to a neutral position.  Attention then placed to the lumbar region.  Pedicles at L2-L3 and L4 were identified using fluoroscopy's.  An incision was made overlying the pedicles from L2-L4.  Jamshidi needle introducers was then passed into the pedicle at L2-L3 and L4 under fluoroscopic with continuous neuromonitoring.  The Jamshidi was passed through the pedicle into the vertebral body.  This was confirmed to be well position both the AP and lateral plane.  Guidewire was passed and the Jamshidi needle introducers were removed.  Using the guidewires each pedicle was then tapped with a screw tap again using direct neuromonitoring.  Each screw tap was found to be in good position.  6.5 x 45 mm NuVasive percutaneous pedicle screws were then passed into the pedicle and into the vertebral body of L2 on L3 and L4.  Fluoroscopy once good good positioning.  Neuromonitoring showed no evidence of any neural compression.  Using the towers a 35 mm rod was passed through the screw towers.  Locking caps then placed over the rod and screws and were sequentially tightened.  Final tightening was achieved.  The towers were removed.  AP and lateral final images reveal good position of the cages and the hardware with proper operative level with normal alignment of the spine.  No evidence of any retained objects other than the planned instrumentation.  Wounds were irrigated then closed in layers with Vicryl sutures.  Steri-Strips and sterile dressing were applied.  No apparent complications.  Patient tolerated procedure well and she returns to the recovery room postop.

## 2023-04-29 NOTE — Brief Op Note (Signed)
 04/29/2023  10:40 AM  PATIENT:  Beth Krause  63 y.o. female  PRE-OPERATIVE DIAGNOSIS:  Degenerative spondylolisthesis  POST-OPERATIVE DIAGNOSIS:  Degenerative spondylolisthesis  PROCEDURE:  Procedure(s) with comments: ANTERIOR LATERAL LUMBAR FUSION WITH PERCUTANEOUS SCREW 2 LEVEL (Left) - XLIF - left - Lumbar Two- Lumbar Three - Lumbar Three-Lumbar Four with percutaneous screws - Posterior Lateral and Interbody fusion  SURGEON:  Surgeons and Role:    Julio Sicks, MD - Primary  PHYSICIAN ASSISTANT:   ASSISTANTSMarland Mcalpine   ANESTHESIA:   general  EBL:  100 mL   BLOOD ADMINISTERED:none  DRAINS: none   LOCAL MEDICATIONS USED:  MARCAINE     SPECIMEN:  No Specimen  DISPOSITION OF SPECIMEN:  N/A  COUNTS:  YES  TOURNIQUET:  * No tourniquets in log *  DICTATION: .Dragon Dictation  PLAN OF CARE: Admit for overnight observation  PATIENT DISPOSITION:  PACU - hemodynamically stable.   Delay start of Pharmacological VTE agent (>24hrs) due to surgical blood loss or risk of bleeding: yes

## 2023-04-29 NOTE — Anesthesia Procedure Notes (Signed)
 Procedure Name: Intubation Date/Time: 04/29/2023 8:14 AM  Performed by: Chelsea Aus, CRNAPre-anesthesia Checklist: Patient identified, Emergency Drugs available, Suction available and Patient being monitored Patient Re-evaluated:Patient Re-evaluated prior to induction Preoxygenation: Pre-oxygenation with 100% oxygen Induction Type: IV induction Ventilation: Mask ventilation without difficulty Laryngoscope Size: Mac and 3 Grade View: Grade I Tube type: Oral Tube size: 7.0 mm Number of attempts: 1 Placement Confirmation: ETT inserted through vocal cords under direct vision, positive ETCO2 and breath sounds checked- equal and bilateral Secured at: 21 cm Tube secured with: Tape Dental Injury: Teeth and Oropharynx as per pre-operative assessment

## 2023-04-30 DIAGNOSIS — M4316 Spondylolisthesis, lumbar region: Secondary | ICD-10-CM | POA: Diagnosis not present

## 2023-04-30 MED ORDER — BACLOFEN 10 MG PO TABS: 10.0000 mg | ORAL_TABLET | Freq: Four times a day (QID) | ORAL | 1 refills | Status: AC

## 2023-04-30 MED ORDER — BACLOFEN 10 MG PO TABS: 10.0000 mg | ORAL_TABLET | Freq: Three times a day (TID) | ORAL | 1 refills | Status: DC | PRN

## 2023-04-30 MED ORDER — OXYCODONE HCL 10 MG PO TABS: 10.0000 mg | ORAL_TABLET | ORAL | 0 refills | Status: DC | PRN

## 2023-04-30 NOTE — Progress Notes (Signed)
 RN went over DC instructions with the patient and she stated understanding, IV has been removed Husband at bedside and belongings packed. Medications escribed to home pharmacy.

## 2023-04-30 NOTE — Discharge Summary (Signed)
 Physician Discharge Summary  Patient ID: Beth Krause MRN: [id number] DOB/AGE: 04/28/1960 63 y.o.  Admit date: 04/29/2023 Discharge date: 04/30/2023  Admission Diagnoses:  L2-3, L3-4 severe degenerative disc disease with foraminal stenosis and degenerative spondylolisthesis with radiculopathy    Discharge Diagnoses: same   Discharged Condition: good  Hospital Course: The patient was admitted on 04/29/2023 and taken to the operating room where the patient underwent XLIF L2-3, L3-4 with posterior perc screws. The patient tolerated the procedure well and was taken to the recovery room and then to the floor in stable condition. The hospital course was routine. There were no complications. The wound remained clean dry and intact. Pt had appropriate back soreness. No complaints of leg pain or new N/T/W. The patient remained afebrile with stable vital signs, and tolerated a regular diet. The patient continued to increase activities, and pain was well controlled with oral pain medications.   Consults: None  Significant Diagnostic Studies:  Results for orders placed or performed during the hospital encounter of 04/29/23  ABO/Rh   Collection Time: 04/29/23  7:15 AM  Result Value Ref Range   ABO/RH(D)      A POS Performed at [facility], [address], [place], [place] [address]     DG Lumbar Spine 2-3 Views Result Date: 04/29/2023 CLINICAL DATA:  Elective surgery. EXAM: LUMBAR SPINE - 2-3 VIEW COMPARISON:  Preoperative imaging. FINDINGS: Four fluoroscopic spot views of the lumbar spine submitted from the operating room. Left lateral rod and pedicle screw fixation L2 through L4 with interbody spacers. Fluoroscopy time 2 minutes 57 seconds. Dose 150 mGy. IMPRESSION: Intraoperative fluoroscopy during lumbar fusion. Electronically Signed   By: Narda Rutherford M.D.   On: 04/29/2023 11:43   DG C-Arm 1-60 Min-No Report Result Date: 04/29/2023 Fluoroscopy was utilized by the requesting physician.   No radiographic interpretation.   DG C-Arm 1-60 Min-No Report Result Date: 04/29/2023 Fluoroscopy was utilized by the requesting physician.  No radiographic interpretation.   DG C-Arm 1-60 Min-No Report Result Date: 04/29/2023 Fluoroscopy was utilized by the requesting physician.  No radiographic interpretation.   MM 3D SCREENING MAMMOGRAM BILATERAL BREAST Result Date: 04/22/2023 CLINICAL DATA:  Screening. EXAM: DIGITAL SCREENING BILATERAL MAMMOGRAM WITH TOMOSYNTHESIS AND CAD TECHNIQUE: Bilateral screening digital craniocaudal and mediolateral oblique mammograms were obtained. Bilateral screening digital breast tomosynthesis was performed. The images were evaluated with computer-aided detection. COMPARISON:  Previous exam(s). ACR Breast Density Category b: There are scattered areas of fibroglandular density. FINDINGS: There are no findings suspicious for malignancy. IMPRESSION: No mammographic evidence of malignancy. A result letter of this screening mammogram will be mailed directly to the patient. RECOMMENDATION: Screening mammogram in one year. (Code:[id number]-01Y) BI-RADS CATEGORY  1: Negative. Electronically Signed   By: Frederico Hamman M.D.   On: 04/22/2023 12:54    Antibiotics:  Anti-infectives (From admission, onward)    Start     Dose/Rate Route Frequency Ordered Stop   04/29/23 1600  ceFAZolin (ANCEF) IVPB 1 g/50 mL premix        1 g 100 mL/hr over 30 Minutes Intravenous Every 8 hours 04/29/23 1158 04/30/23 0000   04/29/23 0645  ceFAZolin (ANCEF) IVPB 2g/100 mL premix        2 g 200 mL/hr over 30 Minutes Intravenous On call to O.R. 04/29/23 1914 04/29/23 0843       Discharge Exam: Blood pressure 128/67, pulse 80, temperature 99.3 F (37.4 C), temperature source Oral, resp. rate 20, height 5\' 2"  (1.575 m), weight 89.8  kg, SpO2 95%. Neurologic: Grossly normal Ambulating and voiding well incision cdi   Discharge Medications:   Allergies as of 04/30/2023       Reactions    Influenza Vaccines Other (See Comments)   Had bad experiences with the vaccine   Other Other (See Comments)   Pollen/ Hay fever        Medication List     TAKE these medications    aspirin EC 81 MG tablet Take 81 mg by mouth at bedtime. Swallow whole.   atorvastatin 20 MG tablet Commonly known as: LIPITOR Take 20 mg by mouth daily.   b complex vitamins capsule Take 1 capsule by mouth daily.   baclofen 10 MG tablet Commonly known as: LIORESAL Take 1 tablet (10 mg total) by mouth 4 (four) times daily. What changed: when to take this   Breo Ellipta 200-25 MCG/INH Aepb Generic drug: fluticasone furoate-vilanterol Inhale 1 puff into the lungs in the morning.   cetirizine 10 MG tablet Commonly known as: ZYRTEC Take 10 mg by mouth in the morning.   levothyroxine 75 MCG tablet Commonly known as: SYNTHROID Take 100 mcg by mouth daily before breakfast.   lisinopril 10 MG tablet Commonly known as: ZESTRIL Take 10 mg by mouth daily.   magnesium gluconate 500 MG tablet Commonly known as: MAGONATE Take 500 mg by mouth daily.   melatonin 5 MG Tabs Take 5 mg by mouth at bedtime as needed (Sleep).   omeprazole 20 MG capsule Commonly known as: PRILOSEC Take 20 mg by mouth daily.   Oxycodone HCl 10 MG Tabs Take 1 tablet (10 mg total) by mouth every 4 (four) hours as needed for severe pain (pain score 7-10).   ProAir HFA 108 (90 Base) MCG/ACT inhaler Generic drug: albuterol Inhale 2 puffs into the lungs every 8 (eight) hours as needed (Asthma).   Vitamin D3 250 MCG (10000 UT) Tabs Take 10,000 Units by mouth 3 (three) times a week.   VITAMIN K PO Take 20 mg by mouth daily.               Durable Medical Equipment  (From admission, onward)           Start     Ordered   04/29/23 1159  DME Walker rolling  Once       Question:  Patient needs a walker to treat with the following condition  Answer:  Degenerative spondylolisthesis   04/29/23 1158   04/29/23  1159  DME 3 n 1  Once        04/29/23 1158            Disposition: home   Final Dx: XLIF L2-3, L3-4, posterior perc screws L2-L4  Discharge Instructions      Remove dressing in 72 hours   Complete by: As directed    Call MD for:  difficulty breathing, headache or visual disturbances   Complete by: As directed    Call MD for:  persistant nausea and vomiting   Complete by: As directed    Call MD for:  redness, tenderness, or signs of infection (pain, swelling, redness, odor or green/yellow discharge around incision site)   Complete by: As directed    Call MD for:  severe uncontrolled pain   Complete by: As directed    Call MD for:  temperature >100.4   Complete by: As directed    Diet - low sodium heart healthy   Complete by: As directed  Increase activity slowly   Complete by: As directed           Signed: Tiana Loft Danikah Budzik 04/30/2023, 9:40 AM

## 2023-04-30 NOTE — Evaluation (Signed)
 Physical Therapy Brief Evaluation and Discharge Note Patient Details Name: Beth Krause MRN: [id number] DOB: Jul 02, 1960 Today's Date: 04/30/2023   History of Present Illness  63 year old female admitted 3/28 with chronic and progressively worsening back pain with radiation into her right anterior thigh with associated numbness and some weakness.  Workup demonstrates evidence of marked multilevel lumbar degeneration worse at L2-3 and L3-4 where she has disc base collapse with degenerative lateral listhesis and severe foraminal stenosis at L3-4 on the right. Pt underwent ANTERIOR LATERAL LUMBAR FUSION WITH PERCUTANEOUS SCREW 2 LEVEL (Left) - XLIF - left - Lumbar Two- Lumbar Three - Lumbar Three-Lumbar Four with percutaneous screws - Posterior Lateral and Interbody fusion on 3/28.  PMH: asthma, HTN  Clinical Impression  Pt admitted with above diagnosis. Education regarding back precautions, brace application, safe mobility with and without RW and practicing stairs completed.  Pt currently without functional limitations and is independent to Modif I  with all mobility. Will go home today.  Sign off.        PT Assessment All further PT needs can be met in the next venue of care  Assistance Needed at Discharge  PRN    Equipment Recommendations None recommended by PT  Recommendations for Other Services       Precautions/Restrictions Precautions Precautions: Back Precaution Booklet Issued: Yes (comment) Recall of Precautions/Restrictions: Intact Required Braces or Orthoses: Spinal Brace Spinal Brace: Applied in sitting position;Lumbar corset Restrictions Weight Bearing Restrictions Per Provider Order: No        Mobility  Bed Mobility       General bed mobility comments: Pt standing in room on arrival.  OT came in during treatment and OT to educate regarding log roll.  Transfers Overall transfer level: Independent                      Ambulation/Gait Ambulation/Gait  assistance: Independent, Modified independent (Device/Increase time) Gait Distance (Feet): 500 Feet Assistive device: Rolling walker (2 wheels), None Gait Pattern/deviations: WFL(Within Functional Limits)   General Gait Details: Ambulated with RW and without with good safety and good balance.  No LOB with min challenges.  Home Activity Instructions    Stairs Stairs: Yes Stairs assistance: Modified independent (Device/Increase time) Stair Management: One rail Right, Step to pattern, Forwards Number of Stairs: 12 General stair comments: Pt able to ascend and descend steps without assist.  Modified Rankin (Stroke Patients Only)        Balance Overall balance assessment: Independent                        Pertinent Vitals/Pain PT - Brief Vital Signs All Vital Signs Stable: Yes Pain Assessment Pain Assessment: Faces Faces Pain Scale: Hurts little more Pain Location: back Pain Descriptors / Indicators: Discomfort, Grimacing, Guarding Pain Intervention(s): Limited activity within patient's tolerance, Monitored during session, Repositioned     Home Living Family/patient expects to be discharged to:: Private residence Living Arrangements: Spouse/significant other Available Help at Discharge: Family;Available 24 hours/day Home Environment: Stairs to enter;Stairs in home;Rail - right  Stairs-Number of Steps: 12 Home Equipment: Rolling Walker (2 wheels);Rollator (4 wheels);Shower seat;Toilet riser;Adaptive equipment Adaptive Equipment: Reacher;Sock aid;Long-handled shoe horn;Long-handled sponge      Prior Function Level of Independence: Independent Comments: Pt works as a PT in SNF    UE/LE Assessment   UE ROM/Strength/Tone/Coordination: WFL    LE ROM/Strength/Tone/Coordination: [facility]      Communication   Communication Communication: No apparent difficulties  Cognition Overall Cognitive Status: Appears within functional limits for tasks  assessed/performed       General Comments General comments (skin integrity, edema, etc.): Pt reports she has had brace for a few weeks and knows how to don/doff.  Husband present during evaluation and understands how to assist pt if needed.    Exercises     Assessment/Plan    PT Problem List Decreased mobility;Decreased activity tolerance;Decreased balance;Decreased knowledge of use of DME;Decreased safety awareness;Decreased knowledge of precautions;Pain       PT Visit Diagnosis Other abnormalities of gait and mobility (R26.89)    No Skilled PT     Co-evaluation                AMPAC 6 Clicks Help needed turning from your back to your side while in a flat bed without using bedrails?: None Help needed moving from lying on your back to sitting on the side of a flat bed without using bedrails?: None Help needed moving to and from a bed to a chair (including a wheelchair)?: None Help needed standing up from a chair using your arms (e.g., wheelchair or bedside chair)?: None Help needed to walk in hospital room?: None Help needed climbing 3-5 steps with a railing? : None 6 Click Score: 24      End of Session Equipment Utilized During Treatment: Back brace Activity Tolerance: Patient tolerated treatment well Patient left: Other (comment) (OT in room with pt to perform eval) Nurse Communication: Mobility status PT Visit Diagnosis: Other abnormalities of gait and mobility (R26.89)     Time: 1610-9604 PT Time Calculation (min) (ACUTE ONLY): 20 min  Charges:   PT Evaluation $PT Eval Low Complexity: 1 Low      Jurrell Royster M,PT Acute Rehab Services [phone]   Bevelyn Buckles  04/30/2023, 8:56 AM

## 2023-04-30 NOTE — Evaluation (Signed)
 Occupational Therapy Evaluation Patient Details Name: Beth Krause MRN: [id number] DOB: 22-Apr-1960 Today's Date: 04/30/2023   History of Present Illness   63 year old female admitted 3/28 with chronic and progressively worsening back pain with radiation into her right anterior thigh with associated numbness and some weakness.  Workup demonstrates evidence of marked multilevel lumbar degeneration worse at L2-3 and L3-4 where she has disc base collapse with degenerative lateral listhesis and severe foraminal stenosis at L3-4 on the right. Pt underwent ANTERIOR LATERAL LUMBAR FUSION WITH PERCUTANEOUS SCREW 2 LEVEL (Left) - XLIF - left - Lumbar Two- Lumbar Three - Lumbar Three-Lumbar Four with percutaneous screws - Posterior Lateral and Interbody fusion on 3/28.  PMH: asthma, HTN     Clinical Impressions Pt is typically independent in ADL and mobility. 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 Today she is supervision for bed mobility, transfers and mobility (performed with and without DME). Demonstrated peri care, mod A for LB dressing (but has AE at home), good demonstration of doff/don brace. Very thorough questions and safety awareness. Reviewed back precautions with ADL focus in full and Pt has handout. All questions/concerns addressed and education complete. OT will sign off at this time and defer to MD for follow up.      If plan is discharge home, recommend the following:   A little help with bathing/dressing/bathroom;Assistance with cooking/housework;Assist for transportation     Functional Status Assessment   Patient has had a recent decline in their functional status and demonstrates the ability to make significant improvements in function in a reasonable and predictable amount of time.     Equipment Recommendations   None recommended by OT (Pt has appropriate DME)     Recommendations for Other  Services   PT consult     Precautions/Restrictions   Precautions Precautions: Back Precaution Booklet Issued: Yes (comment) Recall of Precautions/Restrictions: Intact Required Braces or Orthoses: Spinal Brace Spinal Brace: Applied in sitting position;Lumbar corset Restrictions Weight Bearing Restrictions Per Provider Order: No     Mobility Bed Mobility Overal bed mobility: Needs Assistance Bed Mobility: Rolling, Sidelying to Sit, Sit to Sidelying Rolling: Supervision Sidelying to sit: Supervision     Sit to sidelying: Supervision General bed mobility comments: good implementation of log roll technique. Performed HOB flat and elevated bed like home    Transfers Overall transfer level: Independent                        Balance Overall balance assessment: Mild deficits observed, not formally tested                                         ADL either performed or assessed with clinical judgement   ADL Overall ADL's : Needs assistance/impaired Eating/Feeding: Modified independent   Grooming: Supervision/safety;Standing Grooming Details (indicate cue type and reason): educated in compensatory strategies Upper Body Bathing: Supervision/ safety;With adaptive equipment Upper Body Bathing Details (indicate cue type and reason): has long handle sponge Lower Body Bathing: Supervison/ safety;Sit to/from stand Lower Body Bathing Details (indicate cue type and reason): has long handle sponge. Has a shower chair - but prefers walk in shower (chair does not fit) rather than stepping over tub Upper Body Dressing : Set up;Sitting;Minimal assistance Upper Body Dressing Details (indicate cue type and  reason): min A for donning brace, can don shirts w/o assist Lower Body Dressing: Moderate assistance;Sit to/from stand Lower Body Dressing Details (indicate cue type and reason): Pt has AE for LB dressing and is familiar with it Toilet Transfer:  Supervision/safety;Ambulation   Toileting- Clothing Manipulation and Hygiene: Supervision/safety;Sit to/from stand Toileting - Clothing Manipulation Details (indicate cue type and reason): observed Pt demonstration, and educated on compensatory strategies Tub/ Shower Transfer: Walk-in shower;Supervision/safety;Ambulation   Functional mobility during ADLs: Supervision/safety General ADL Comments: decreased access to LB for ADL, unable to perform figure 4 currently     Vision Ability to See in Adequate Light: 0 Adequate Patient Visual Report: No change from baseline Vision Assessment?: No apparent visual deficits     Perception         Praxis         Pertinent Vitals/Pain Pain Assessment Pain Assessment: Faces Faces Pain Scale: Hurts little more Pain Location: back Pain Descriptors / Indicators: Discomfort, Grimacing, Guarding Pain Intervention(s): Monitored during session, Repositioned     Extremity/Trunk Assessment Upper Extremity Assessment Upper Extremity Assessment: Overall WFL for tasks assessed   Lower Extremity Assessment Lower Extremity Assessment: Defer to PT evaluation   Cervical / Trunk Assessment Cervical / Trunk Assessment: Back Surgery (scoliosis)   Communication Communication Communication: No apparent difficulties   Cognition Arousal: Alert Behavior During Therapy: WFL for tasks assessed/performed Cognition: No apparent impairments             OT - Cognition Comments: very thorough and safety aware                 Following commands: Intact       Cueing  General Comments   Cueing Techniques: Verbal cues;Gestural cues  Pt with good recall of precautions   Exercises     Shoulder Instructions      Home Living Family/patient expects to be discharged to:: Private residence Living Arrangements: Spouse/significant other Available Help at Discharge: Family;Available 24 hours/day Type of Home: House Home Access: Other (comment)  (threshold)     Home Layout: Two level;Bed/bath upstairs Alternate Level Stairs-Number of Steps: 12 Alternate Level Stairs-Rails: Right;Left Bathroom Shower/Tub: Walk-in shower (has both)   Firefighter: Standard     Home Equipment: Agricultural consultant (2 wheels);Rollator (4 wheels);Shower seat;Toilet riser;Adaptive equipment Adaptive Equipment: Reacher;Sock aid;Long-handled shoe horn;Long-handled sponge 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      Prior Functioning/Environment                      OT Problem List: Decreased activity tolerance;Impaired balance (sitting and/or standing);Decreased knowledge of use of DME or AE;Decreased knowledge of precautions;Pain   OT Treatment/Interventions:        OT Goals(Current goals can be found in the care plan section)   Acute Rehab OT Goals Patient Stated Goal: get back to walking OT Goal Formulation: With patient Time For Goal Achievement: 05/14/23 Potential to Achieve Goals: Good   OT Frequency:       Co-evaluation              AM-PAC OT "6 Clicks" Daily Activity     Outcome Measure Help from another person eating meals?: None Help from another person taking care of personal grooming?: None Help from another person toileting, which includes using toliet, bedpan, or urinal?: None Help from another person bathing (including washing, rinsing, drying)?: A Little Help from another person to put on and taking off regular upper body clothing?: A Little Help  from another person to put on and taking off regular lower body clothing?: A Little 6 Click Score: 21   End of Session Equipment Utilized During Treatment: Back brace Nurse Communication: Mobility status;Precautions  Activity Tolerance: Patient tolerated treatment well Patient left: in chair;with call bell/phone within reach;with family/visitor present  OT Visit Diagnosis: Other abnormalities of gait and mobility (R26.89);Other symptoms and signs  involving the nervous system (R29.898)                Time: 2952-8413 OT Time Calculation (min): 28 min Charges:  OT General Charges $OT Visit: 1 Visit OT Evaluation $OT Eval Low Complexity: 1 Low  Nyoka Cowden OTR/L Acute Rehabilitation Services Office: [phone]  Evern Bio [facility] 04/30/2023, 10:29 AM

## 2023-05-01 MED FILL — Thrombin For Soln 5000 Unit: CUTANEOUS | Qty: 2 | Status: AC

## 2023-05-02 ENCOUNTER — Encounter (HOSPITAL_COMMUNITY): Payer: Self-pay | Admitting: Neurosurgery

## 2023-06-07 ENCOUNTER — Ambulatory Visit: Admitting: Podiatry

## 2023-06-07 ENCOUNTER — Encounter: Payer: Self-pay | Admitting: Podiatry

## 2023-06-07 DIAGNOSIS — L600 Ingrowing nail: Secondary | ICD-10-CM

## 2023-06-07 NOTE — Progress Notes (Signed)
   Chief Complaint  Patient presents with   Ingrown Toenail    "I have two ingrown toenails." N - ingrown toenails L - 2nd medial bilateral D - 2 weeks O - suddenly C - sore A - while walking, they hit my shoes T - none    HPI: 63 y.o. female presenting today for evaluation of possible ingrown's to the bilateral second toes.  Recent history of back surgery and she is unable to bend over and trim her toenails.  She noticed over the last few weeks some pain and tenderness associated to the second digit bilateral and concern for ingrown toenail  Past Medical History:  Diagnosis Date   Anemia    hx   Asthma    GERD (gastroesophageal reflux disease)    High cholesterol    Hypertension    Hypothyroidism     Past Surgical History:  Procedure Laterality Date   ANTERIOR LATERAL LUMBAR FUSION WITH PERCUTANEOUS SCREW 2 LEVEL Left 04/29/2023   Procedure: ANTERIOR LATERAL LUMBAR FUSION WITH PERCUTANEOUS SCREW 2 LEVEL;  Surgeon: Agustina Aldrich, MD;  Location: [facility] OR;  Service: Neurosurgery;  Laterality: Left;  XLIF - left - Lumbar Two- Lumbar Three - Lumbar Three-Lumbar Four with percutaneous screws - Posterior Lateral and Interbody fusion   CHOLECYSTECTOMY      Allergies  Allergen Reactions   Influenza Vaccines Other (See Comments)    Had bad experiences with the vaccine   Other Other (See Comments)    Pollen/ Hay fever     Physical Exam: General: The patient is alert and oriented x3 in no acute distress.  Dermatology: Skin is warm, dry and supple bilateral lower extremities.  Ingrowing portion of toenail noted to the medial border of the bilateral second toes with some tenderness with palpation  Vascular: Palpable pedal pulses bilaterally. Capillary refill within normal limits.  No appreciable edema.  No erythema.  Neurological: Grossly intact via light touch  Musculoskeletal Exam: No pedal deformities noted   Assessment/Plan of Care: 1.  Ingrown toenail second digit  bilateral 2.  Tenderness bilateral toenails  -Patient evaluated. -The patient has never had ingrown toenails before and currently the symptoms are somewhat mild.  I do believe that simple debridement should help alleviate her symptoms -Mechanical debridement of nails 1-5 bilateral was performed using a nail nipper without incident or bleeding as a courtesy for the patient -Patient felt significant relief after debridement -Return to clinic as needed       Dot Gazella, DPM [facility]  Dr. Dot Gazella, DPM    [address] [place], [place] [address]                Office [phone]  Fax [phone]

## 2023-09-01 ENCOUNTER — Other Ambulatory Visit: Payer: Self-pay | Admitting: Obstetrics and Gynecology

## 2023-09-01 DIAGNOSIS — Z1231 Encounter for screening mammogram for malignant neoplasm of breast: Secondary | ICD-10-CM

## 2023-11-18 ENCOUNTER — Ambulatory Visit: Admitting: Podiatry

## 2023-11-29 ENCOUNTER — Ambulatory Visit: Admitting: Podiatry

## 2023-11-29 ENCOUNTER — Ambulatory Visit

## 2023-11-29 ENCOUNTER — Encounter: Payer: Self-pay | Admitting: Podiatry

## 2023-11-29 VITALS — Ht 62.0 in | Wt 198.0 lb

## 2023-11-29 DIAGNOSIS — D2372 Other benign neoplasm of skin of left lower limb, including hip: Secondary | ICD-10-CM

## 2023-11-29 NOTE — Progress Notes (Signed)
   Chief Complaint  Patient presents with   Plantar Warts    Pt is here due to possible plantar wart to th bottom of the left foot, she states that the spot has been there for 4-6 weeks causes some pain while walking on it.    Subjective: 63 y.o. female presenting to the office today for new onset of pain and tenderness to the plantar aspect of the left foot lateral column ongoing for about 6 weeks now.  No history of injury   Past Medical History:  Diagnosis Date   Anemia    hx   Asthma    GERD (gastroesophageal reflux disease)    High cholesterol    Hypertension    Hypothyroidism     Past Surgical History:  Procedure Laterality Date   ANTERIOR LATERAL LUMBAR FUSION WITH PERCUTANEOUS SCREW 2 LEVEL Left 04/29/2023   Procedure: ANTERIOR LATERAL LUMBAR FUSION WITH PERCUTANEOUS SCREW 2 LEVEL;  Surgeon: Louis Shove, MD;  Location: [facility] OR;  Service: Neurosurgery;  Laterality: Left;  XLIF - left - Lumbar Two- Lumbar Three - Lumbar Three-Lumbar Four with percutaneous screws - Posterior Lateral and Interbody fusion   CHOLECYSTECTOMY      Allergies  Allergen Reactions   Influenza Vaccines Other (See Comments)    Had bad experiences with the vaccine   Other Other (See Comments)    Pollen/ Hay fever     Objective:  Physical Exam General: Alert and oriented x3 in no acute distress  Dermatology: Hyperkeratotic lesion(s) present on the plantar aspect of the left foot. Pain on palpation with a central nucleated core noted. Skin is warm, dry and supple bilateral lower extremities. Negative for open lesions or macerations.  Vascular: Palpable pedal pulses bilaterally. No edema or erythema noted. Capillary refill within normal limits.  Neurological: Grossly intact via light touch  Musculoskeletal Exam: Pain on palpation at the keratotic lesion(s) noted. Range of motion within normal limits bilateral. Muscle strength 5/5 in all groups bilateral.  Assessment: 1.  Symptomatic benign skin  lesion   Plan of Care:  -Patient evaluated -Excisional debridement of keratoic lesion(s) using a chisel blade was performed without incident.  -Salicylic acid applied with a bandaid.  Additional salicylic acid provided to apply daily under occlusion with a Band-Aid -Return to the clinic PRN.   Thresa EMERSON Sar, DPM [facility]  Dr. Thresa EMERSON Sar, DPM    [address] [place], [place] [address]                Office [phone]  Fax [phone]

## 2023-12-27 ENCOUNTER — Ambulatory Visit: Admitting: Podiatry

## 2023-12-27 ENCOUNTER — Encounter: Payer: Self-pay | Admitting: Podiatry

## 2023-12-27 VITALS — Ht 62.0 in | Wt 198.0 lb

## 2023-12-27 DIAGNOSIS — M722 Plantar fascial fibromatosis: Secondary | ICD-10-CM

## 2023-12-27 MED ORDER — MELOXICAM 15 MG PO TABS: 15.0000 mg | ORAL_TABLET | Freq: Every day | ORAL | 1 refills | Status: DC

## 2023-12-27 MED ORDER — METHYLPREDNISOLONE 4 MG PO TBPK: ORAL_TABLET | ORAL | 0 refills | Status: DC

## 2024-01-08 DIAGNOSIS — M722 Plantar fascial fibromatosis: Secondary | ICD-10-CM | POA: Diagnosis not present

## 2024-01-08 MED ORDER — BETAMETHASONE SOD PHOS & ACET 6 (3-3) MG/ML IJ SUSP
3.0000 mg | Freq: Once | INTRAMUSCULAR | Status: AC
  Administered 2024-01-08: 3 mg via INTRA_ARTICULAR

## 2024-01-08 NOTE — Progress Notes (Signed)
   Chief Complaint  Patient presents with   Injections    Pt is here to receive injection in to the heel for pain left.    Subjective: 63 y.o. female presenting today for follow-up evaluation of plantar fasciitis left heel.   Past Medical History:  Diagnosis Date   Anemia    hx   Asthma    GERD (gastroesophageal reflux disease)    High cholesterol    Hypertension    Hypothyroidism     Objective: Physical Exam General: The patient is alert and oriented x3 in no acute distress.   Dermatology: Hyperkeratotic skin lesion(s) noted to the plantar aspect of the left foot just plantar to the fifth metatarsal tubercle.  There does appear to be a small cluster of lesions. Pinpoint bleeding noted upon debridement. Skin is warm, dry and supple bilateral lower extremities. Negative for open lesions or macerations.   Vascular: Palpable pedal pulses bilaterally. No edema or erythema noted. Capillary refill within normal limits.   Neurological: Grossly intact via light touch   Musculoskeletal Exam: Tenderness with palpation to the medial continue tubercle plantar fascia left.   Assessment: #1 plantar fasciitis left   Plan of Care:  -Patient evaluated -Injection of 0.5 cc Celestone  Soluspan injected into the plantar fascia left  -Continue wearing custom molded orthotics and good supportive stability shoes  -Patient is to return to clinic as needed  *Physical therapist  Thresa EMERSON Sar, DPM [facility]  Dr. Thresa EMERSON Sar, DPM    [address] [place], [place] [address]                Office [phone]  Fax [phone]

## 2024-01-11 LAB — COLOGUARD: COLOGUARD: NEGATIVE

## 2024-02-14 ENCOUNTER — Encounter: Payer: Self-pay | Admitting: Family Medicine

## 2024-02-14 ENCOUNTER — Ambulatory Visit: Admitting: Family Medicine

## 2024-02-14 VITALS — BP 103/47 | HR 77 | Ht 62.0 in | Wt 208.0 lb

## 2024-02-14 DIAGNOSIS — Z7689 Persons encountering health services in other specified circumstances: Secondary | ICD-10-CM

## 2024-02-14 DIAGNOSIS — I10 Essential (primary) hypertension: Secondary | ICD-10-CM

## 2024-02-14 NOTE — Patient Instructions (Addendum)
 Thank you for coming to the office today.  Contact for refill on Lisinopril  10mg  prior to your upcoming apt.  We will update screening.  DUE for FASTING BLOOD WORK (no food or drink after midnight before the lab appointment, only water or coffee without cream/sugar on the morning of)  SCHEDULE Lab Only visit in the morning at the clinic for lab draw in 3 MONTHS   - Make sure Lab Only appointment is at about 1 week before your next appointment, so that results will be available  For Lab Results, once available within 2-3 days of blood draw, you can can log in to MyChart online to view your results and a brief explanation. Also, we can discuss results at next follow-up visit.   Please schedule a Follow-up Appointment to: No follow-ups on file.  If you have any other questions or concerns, please feel free to call the office or send a message through MyChart. You may also schedule an earlier appointment if necessary.  Additionally, you may be receiving a survey about your experience at our office within a few days to 1 week by e-mail or mail. We value your feedback.  Marsa Officer, DO [facility], [place]

## 2024-02-14 NOTE — Addendum Note (Signed)
 Encounter addended by: Rane Dumm L on: 02/14/2024 10:08 AM  Actions taken: Imaging Exam ended

## 2024-02-14 NOTE — Progress Notes (Unsigned)
 "  Subjective:    Patient ID: Beth Krause, female    DOB: 10-29-1960, 64 y.o.   MRN: [id number]  Beth Krause is a 65 y.o. female presenting on 02/14/2024 for Establish Care   HPI  Discussed the use of AI scribe software for clinical note transcription with the patient, who gave verbal consent to proceed.  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  Update Pap ***Dr Verdon Glenn OBGYN  Mammogram ***  Colonoscopy 2014, negative repeat 10 years Cologuard completed 2022 and 01/03/24 negative.       No data to display              No data to display           Past Medical History:  Diagnosis Date   Anemia    hx   Asthma    GERD (gastroesophageal reflux disease)    High cholesterol    Hypertension    Hypothyroidism    Past Surgical History:  Procedure Laterality Date   ANTERIOR LATERAL LUMBAR FUSION WITH PERCUTANEOUS SCREW 2 LEVEL Left 04/29/2023   Procedure: ANTERIOR LATERAL LUMBAR FUSION WITH PERCUTANEOUS SCREW 2 LEVEL;  Surgeon: Gunnels Victory, MD;  Location: [facility] OR;  Service: Neurosurgery;  Laterality: Left;  XLIF - left - Lumbar Two- Lumbar Three - Lumbar Three-Lumbar Four with percutaneous screws - Posterior Lateral and Interbody fusion   CHOLECYSTECTOMY     Social History    Socioeconomic History   Marital status: Married    Spouse name: Not on file   Number of children: 0   Years of education: Not on file   Highest education level: Not on file  Occupational History   Not on file  Tobacco Use   Smoking status: Never   Smokeless tobacco: Never  Vaping Use   Vaping status: Never Used  Substance and Sexual Activity   Alcohol use: No   Drug use: Never   Sexual activity: Not on file  Other Topics Concern   Not on file  Social History Narrative   Not on file   Social Drivers of Health   Tobacco Use: Low Risk (02/14/2024)   Patient History    Smoking Tobacco Use: Never    Smokeless Tobacco Use: Never    Passive Exposure: Not on file  Financial Resource Strain: Low Risk (10/09/2023)   Received from [facility]   Overall Financial Resource Strain (CARDIA)    How hard is it for you to pay for the very basics like food, housing, medical care, and heating?: Not hard at all  Food Insecurity: No Food Insecurity (12/20/2023)   Received from [facility]   Epic    Within the past 12 months, you worried that your food would run out before you got the money to buy more.: Never true    Within the  past 12 months, the food you bought just didn't last and you didn't have money to get more.: Never true  Transportation Needs: No Transportation Needs (12/20/2023)   Received from [facility] - Transportation    Lack of Transportation (Medical): No    Lack of Transportation (Non-Medical): No  Physical Activity: Not on file  Stress: Not on file  Social Connections: Not on file  Intimate Partner Violence: Not At Risk (04/30/2023)   Humiliation, Afraid, Rape, and Kick questionnaire    Fear of Current or Ex-Partner: No    Emotionally Abused: No    Physically Abused: No    Sexually Abused: No  Depression (PHQ2-9): Not on file  Alcohol Screen: Not on file  Housing: Low Risk  (08/30/2023)   Received from [facility]   Epic    In the last 12 months, was there a time when you were not able to pay the mortgage or rent on time?: No    In the past 12 months, how many times have you moved where you were living?: 0    At any time in the past 12 months, were you homeless or living in a shelter (including now)?: No  Utilities: Low Risk (12/20/2023)   Received from [facility]   Utilities    Within the past 12 months, have you been unable to get utilities(heat, electricity) when it was really needed?: No  Health Literacy: Not on file   Family History  Problem Relation Age of Onset   Breast cancer Neg Hx    Current Outpatient Medications on File Prior to Visit  Medication Sig   albuterol  (VENTOLIN  HFA) 108 (90 Base) MCG/ACT inhaler Inhale 1 puff into the lungs.   atorvastatin  (LIPITOR) 20 MG tablet Take 20 mg by mouth daily.   b complex vitamins capsule Take 1 capsule by mouth daily.   baclofen  (LIORESAL ) 10 MG tablet Take 1 tablet (10 mg total) by mouth 4 (four) times daily.   BREO ELLIPTA  200-25 MCG/INH AEPB Inhale 1 puff into the lungs in the morning.   cetirizine (ZYRTEC) 10 MG tablet Take 10 mg by mouth in the morning.   Cholecalciferol  (VITAMIN D3) 250 MCG (10000 UT) TABS Take 10,000 Units by mouth 3 (three) times a week.   levothyroxine  (SYNTHROID ) 75 MCG tablet Take 100 mcg by mouth daily before breakfast.   lisinopril  (PRINIVIL ,ZESTRIL ) 10 MG tablet Take 10 mg by mouth daily.   magnesium gluconate (MAGONATE) 500 MG tablet Take 500 mg by mouth daily.   melatonin 5 MG TABS Take 5 mg by mouth at bedtime as needed (Sleep).   omeprazole (PRILOSEC) 20 MG capsule Take 20 mg by mouth daily.   PROAIR  HFA 108 (90 BASE) MCG/ACT inhaler Inhale 2 puffs into the lungs every 8 (eight) hours as needed (Asthma).   VITAMIN K PO Take 20 mg by mouth daily.   No current facility-administered medications on file prior to visit.    Review of Systems Per HPI unless specifically  indicated above     Objective:    BP (!) 103/47 (BP Location: Right Arm, Patient Position: Sitting)   Pulse 77   Ht 5' 2 (1.575 m)   Wt 208 lb (94.3 kg)   SpO2 99%   BMI 38.04 kg/m   Wt Readings from Last 3 Encounters:  02/14/24 208 lb (94.3 kg)  12/27/23 198 lb (89.8 kg)  11/29/23 198 lb (89.8 kg)    Physical Exam  Results for orders placed or performed during the hospital encounter of 04/29/23  ABO/Rh   Collection Time: 04/29/23  7:15 AM  Result Value Ref Range   ABO/RH(D)      A POS Performed at [facility], [address], [place], [place] [address]       Assessment & Plan:   Problem List Items Addressed This Visit   None    Updated Health Maintenance information ***- Reviewed recent lab results with patient Encouraged improvement to lifestyle with diet and exercise -*** Goal of weight loss  Assessment and Plan Assessment & Plan      No orders of the defined types were placed in this encounter.   No orders of the defined types were placed in this encounter.    Follow up plan: No follow-ups on file.  ***TSH T4  Marsa Officer, DO [facility] 02/14/2024, 8:51 AM  "

## 2024-02-15 ENCOUNTER — Other Ambulatory Visit: Payer: Self-pay | Admitting: Family Medicine

## 2024-02-15 DIAGNOSIS — E039 Hypothyroidism, unspecified: Secondary | ICD-10-CM

## 2024-02-15 DIAGNOSIS — R7309 Other abnormal glucose: Secondary | ICD-10-CM

## 2024-02-15 DIAGNOSIS — Z0000 Encounter for general adult medical examination without abnormal findings: Secondary | ICD-10-CM

## 2024-02-15 DIAGNOSIS — N1831 Chronic kidney disease, stage 3a: Secondary | ICD-10-CM

## 2024-02-15 DIAGNOSIS — D649 Anemia, unspecified: Secondary | ICD-10-CM

## 2024-02-15 DIAGNOSIS — I10 Essential (primary) hypertension: Secondary | ICD-10-CM

## 2024-05-15 ENCOUNTER — Other Ambulatory Visit

## 2024-05-23 ENCOUNTER — Encounter: Admitting: Family Medicine
